# Patient Record
Sex: Female | Born: 1935 | Race: White | Hispanic: No | Smoking: Never smoker
Health system: Southern US, Community
[De-identification: ages and names within clinical notes are randomized; demographics above are authoritative.]

## PROBLEM LIST (undated history)

## (undated) DIAGNOSIS — E785 Hyperlipidemia, unspecified: Secondary | ICD-10-CM

## (undated) DIAGNOSIS — I10 Essential (primary) hypertension: Secondary | ICD-10-CM

## (undated) DIAGNOSIS — M8580 Other specified disorders of bone density and structure, unspecified site: Secondary | ICD-10-CM

## (undated) DIAGNOSIS — N6019 Diffuse cystic mastopathy of unspecified breast: Secondary | ICD-10-CM

## (undated) DIAGNOSIS — H269 Unspecified cataract: Secondary | ICD-10-CM

## (undated) DIAGNOSIS — N952 Postmenopausal atrophic vaginitis: Secondary | ICD-10-CM

## (undated) DIAGNOSIS — J449 Chronic obstructive pulmonary disease, unspecified: Secondary | ICD-10-CM

## (undated) HISTORY — DX: Postmenopausal atrophic vaginitis: N95.2

## (undated) HISTORY — DX: Diffuse cystic mastopathy of unspecified breast: N60.19

## (undated) HISTORY — DX: Unspecified cataract: H26.9

## (undated) HISTORY — DX: Chronic obstructive pulmonary disease, unspecified: J44.9

## (undated) HISTORY — DX: Essential (primary) hypertension: I10

## (undated) HISTORY — DX: Other specified disorders of bone density and structure, unspecified site: M85.80

## (undated) HISTORY — DX: Hyperlipidemia, unspecified: E78.5

---

## 2000-01-25 ENCOUNTER — Other Ambulatory Visit: Admission: RE | Admit: 2000-01-25 | Discharge: 2000-01-25 | Payer: Self-pay | Admitting: Family Medicine

## 2001-04-08 ENCOUNTER — Other Ambulatory Visit: Admission: RE | Admit: 2001-04-08 | Discharge: 2001-04-08 | Payer: Self-pay | Admitting: Family Medicine

## 2003-05-25 ENCOUNTER — Other Ambulatory Visit: Admission: RE | Admit: 2003-05-25 | Discharge: 2003-05-25 | Payer: Self-pay | Admitting: Family Medicine

## 2005-06-26 ENCOUNTER — Other Ambulatory Visit: Admission: RE | Admit: 2005-06-26 | Discharge: 2005-06-26 | Payer: Self-pay | Admitting: Family Medicine

## 2005-10-16 ENCOUNTER — Ambulatory Visit: Payer: Self-pay | Admitting: Gastroenterology

## 2005-10-30 ENCOUNTER — Ambulatory Visit: Payer: Self-pay | Admitting: Gastroenterology

## 2005-12-25 ENCOUNTER — Ambulatory Visit: Payer: Self-pay | Admitting: Cardiology

## 2009-07-04 LAB — HM PAP SMEAR

## 2009-10-20 LAB — FECAL OCCULT BLOOD, GUAIAC: Fecal Occult Blood: NEGATIVE

## 2010-04-18 LAB — HM MAMMOGRAPHY

## 2010-11-23 ENCOUNTER — Encounter: Payer: Self-pay | Admitting: Family Medicine

## 2010-11-23 DIAGNOSIS — M8580 Other specified disorders of bone density and structure, unspecified site: Secondary | ICD-10-CM

## 2010-11-23 DIAGNOSIS — N952 Postmenopausal atrophic vaginitis: Secondary | ICD-10-CM

## 2010-11-23 DIAGNOSIS — J449 Chronic obstructive pulmonary disease, unspecified: Secondary | ICD-10-CM

## 2010-11-23 DIAGNOSIS — E785 Hyperlipidemia, unspecified: Secondary | ICD-10-CM

## 2010-11-23 DIAGNOSIS — R3589 Other polyuria: Secondary | ICD-10-CM | POA: Insufficient documentation

## 2010-11-23 DIAGNOSIS — N6019 Diffuse cystic mastopathy of unspecified breast: Secondary | ICD-10-CM

## 2013-01-12 ENCOUNTER — Ambulatory Visit (INDEPENDENT_AMBULATORY_CARE_PROVIDER_SITE_OTHER): Payer: MEDICARE | Admitting: General Practice

## 2013-01-12 ENCOUNTER — Encounter: Payer: Self-pay | Admitting: General Practice

## 2013-01-12 ENCOUNTER — Telehealth: Payer: Self-pay | Admitting: Nurse Practitioner

## 2013-01-12 VITALS — BP 174/81 | HR 92 | Temp 97.7°F | Ht 61.0 in | Wt 118.5 lb

## 2013-01-12 DIAGNOSIS — L0390 Cellulitis, unspecified: Secondary | ICD-10-CM

## 2013-01-12 DIAGNOSIS — L0291 Cutaneous abscess, unspecified: Secondary | ICD-10-CM

## 2013-01-12 MED ORDER — SULFAMETHOXAZOLE-TRIMETHOPRIM 800-160 MG PO TABS: 1.0000 | ORAL_TABLET | Freq: Two times a day (BID) | ORAL | Status: DC

## 2013-01-12 NOTE — Patient Instructions (Addendum)
Cellulitis Cellulitis is an infection of the skin and the tissue beneath it. The infected area is usually red and tender. Cellulitis occurs most often in the arms and lower legs.   CAUSES   Cellulitis is caused by bacteria that enter the skin through cracks or cuts in the skin. The most common types of bacteria that cause cellulitis are Staphylococcus and Streptococcus. SYMPTOMS    Redness and warmth.   Swelling.   Tenderness or pain.   Fever.  DIAGNOSIS  Your caregiver can usually determine what is wrong based on a physical exam. Blood tests may also be done. TREATMENT   Treatment usually involves taking an antibiotic medicine. HOME CARE INSTRUCTIONS    Take your antibiotics as directed. Finish them even if you start to feel better.   Keep the infected arm or leg elevated to reduce swelling.   Apply a warm cloth to the affected area up to 4 times per day to relieve pain.   Only take over-the-counter or prescription medicines for pain, discomfort, or fever as directed by your caregiver.   Keep all follow-up appointments as directed by your caregiver.  SEEK MEDICAL CARE IF:    You notice red streaks coming from the infected area.   Your red area gets larger or turns dark in color.   Your bone or joint underneath the infected area becomes painful after the skin has healed.   Your infection returns in the same area or another area.   You notice a swollen bump in the infected area.   You develop new symptoms.  SEEK IMMEDIATE MEDICAL CARE IF:    You have a fever.   You feel very sleepy.   You develop vomiting or diarrhea.   You have a general ill feeling (malaise) with muscle aches and pains.  MAKE SURE YOU:    Understand these instructions.   Will watch your condition.   Will get help right away if you are not doing well or get worse.  Document Released: 05/22/2005 Document Revised: 02/11/2012 Document Reviewed: 10/28/2011 ExitCare Patient Information 2013  [employer].    

## 2013-01-12 NOTE — Progress Notes (Signed)
  Subjective:    Patient ID: Morgan Spears, female    DOB: Dec 07, 1924, 79 y.o.   MRN: [id number]  HPI Presents today with left lower arm healing wound. Reports hitting her arm and breaking the skin on last Tuesday. Reports the area is sore and slightly red. Reports the area bleed a little initially. Reports applying absorbine junior and antibiotic ointment, with no effects.     Review of Systems  Constitutional: Negative for fever and chills.  Respiratory: Negative for cough, chest tightness and shortness of breath.   Cardiovascular: Negative for chest pain and palpitations.  Skin: Positive for wound.       Scab to left lower arm   Neurological: Negative for dizziness, weakness and headaches.       Objective:   Physical Exam  Constitutional: She is oriented to person, place, and time. She appears well-developed and well-nourished.  Cardiovascular: Normal rate, regular rhythm and normal heart sounds.   Pulmonary/Chest: Effort normal and breath sounds normal. No respiratory distress. She exhibits no tenderness.  Neurological: She is alert and oriented to person, place, and time.  Skin: Skin is warm and dry. There is erythema.  Erythema to left lower arm, surrounding a 1/2 inch X 1/4 inch scabbed area.   Psychiatric: She has a normal mood and affect.          Assessment & Plan:  1. Cellulitis - sulfamethoxazole-trimethoprim (BACTRIM DS,SEPTRA DS) 800-160 MG per tablet; Take 1 tablet by mouth 2 (two) times daily.  Dispense: 14 tablet; Refill: 0 Keep area clean and dry Proper handwashing RTO if symptoms worsen Patient verbalized understanding Coralie Keens, FNP-C

## 2013-01-12 NOTE — Telephone Encounter (Signed)
APPT MADE

## 2013-01-15 ENCOUNTER — Telehealth: Payer: Self-pay | Admitting: Nurse Practitioner

## 2013-01-15 NOTE — Telephone Encounter (Signed)
Patient believes she had a TIA 2 days ago.  Patient was raising her window and her "head felt really funny like it burst open.  It didn't hurt though."  Sensation lasted approximately 5 minutes and then resolved.   Denies vision problems, weakness, or headache.  She feels completely normal now.    Speech was clear and patient was alert and oriented during phone call.

## 2013-01-15 NOTE — Telephone Encounter (Signed)
First appointment available is 01/19/13.  Suggested patient go to ER but she wanted to wait for appointment.  Advised to call 911 if she has any suspicious symptoms at all.  Patient agreed.  Appt scheduled for 01/19/13 with Paulene Floor, NP.

## 2013-01-19 ENCOUNTER — Encounter: Payer: Self-pay | Admitting: Nurse Practitioner

## 2013-01-19 ENCOUNTER — Ambulatory Visit (INDEPENDENT_AMBULATORY_CARE_PROVIDER_SITE_OTHER): Payer: MEDICARE | Admitting: Nurse Practitioner

## 2013-01-19 VITALS — BP 161/65 | HR 79 | Temp 97.5°F | Wt 116.0 lb

## 2013-01-19 DIAGNOSIS — R5383 Other fatigue: Secondary | ICD-10-CM

## 2013-01-19 DIAGNOSIS — R531 Weakness: Secondary | ICD-10-CM

## 2013-01-19 LAB — ANEMIA PANEL 7
%SAT: 23 % (ref 20–55)
ABS Retic: 56.4 10*3/uL (ref 19.0–186.0)
Ferritin: 522 ng/mL — ABNORMAL HIGH (ref 10–291)
Iron: 65 ug/dL (ref 42–145)
MCV: 86.6 fL (ref 78.0–100.0)
Platelets: 205 10*3/uL (ref 150–400)
RBC.: 4.7 MIL/uL (ref 3.87–5.11)
RBC: 4.7 MIL/uL (ref 3.87–5.11)
RDW: 15.1 % (ref 11.5–15.5)
WBC: 6.2 10*3/uL (ref 4.0–10.5)

## 2013-01-19 LAB — THYROID PANEL WITH TSH
Free Thyroxine Index: 2.7 (ref 1.0–3.9)
T3 Uptake: 27.2 % (ref 22.5–37.0)
T4, Total: 9.8 ug/dL (ref 5.0–12.5)
TSH: 3.507 u[IU]/mL (ref 0.350–4.500)

## 2013-01-19 NOTE — Progress Notes (Signed)
  Subjective:    Patient ID: Morgan Spears, female    DOB: [birth year], 79 y.o.   MRN: [id number]  HPI - Patient in stating that she had a mini stroke- [place] Spaniel that she felt like "explosion in head" and patient felt weak for a few days. Patient said that she had no one sided weakness, no facial dooping and no slurred speech. Patient says she just feels weak and tired.    Review of Systems  Constitutional: Negative for fever, chills, activity change and appetite change.  Respiratory: Negative for chest tightness and shortness of breath.   Cardiovascular: Negative for chest pain, palpitations and leg swelling.  Neurological: Positive for weakness (bilaterally). Negative for tremors, numbness and headaches.       Objective:   Physical Exam  Constitutional: She is oriented to person, place, and time. She appears well-developed and well-nourished.  HENT:  Head: Normocephalic.  Right Ear: External ear normal.  Nose: Nose normal.  Mouth/Throat: Oropharynx is clear and moist.  Eyes: EOM are normal. Pupils are equal, round, and reactive to light.  Neck: Normal range of motion. Neck supple.  Cardiovascular: Normal rate and normal heart sounds.   Pulmonary/Chest: Effort normal and breath sounds normal.  Musculoskeletal: Normal range of motion.  Neurological: She is alert and oriented to person, place, and time. She has normal reflexes. No cranial nerve deficit.  Skin: Skin is warm and dry.  Psychiatric: She has a normal mood and affect. Her behavior is normal. Judgment and thought content normal.  BP 161/65  Pulse 79  Temp(Src) 97.5 F (36.4 C) (Oral)  Wt 116 lb (52.617 kg)  BMI 21.93 kg/m2         Assessment & Plan:  1. Weakness Rest Force fluids RTO or go to ER is symptoms reoccur - Anemia panel 7 - Thyroid Panel With TSH - Vitamin B12  Mary-Margaret Daphine Deutscher, FNP

## 2013-01-19 NOTE — Patient Instructions (Signed)
Weakness  Weakness is a lack of strength. It may be felt all over the body (generalized) or in one specific part of the body (focal). Some causes of weakness can be serious. You may need further medical evaluation, especially if you are elderly or you have a history of immunosuppression (such as chemotherapy or HIV), kidney disease, heart disease, or diabetes.  CAUSES   Weakness can be caused by many different things, including:  · Infection.  · Physical exhaustion.  · Internal bleeding or other blood loss that results in a lack of red blood cells (anemia).  · Dehydration. This cause is more common in elderly people.  · Side effects or electrolyte abnormalities from medicines, such as pain medicines or sedatives.  · Emotional distress, anxiety, or depression.  · Circulation problems, especially severe peripheral arterial disease.  · Heart disease, such as rapid atrial fibrillation, bradycardia, or heart failure.  · Nervous system disorders, such as Guillain-Barré syndrome, multiple sclerosis, or stroke.  DIAGNOSIS   To find the cause of your weakness, your caregiver will take your history and perform a physical exam. Lab tests or X-rays may also be ordered, if needed.  TREATMENT   Treatment of weakness depends on the cause of your symptoms and can vary greatly.  HOME CARE INSTRUCTIONS   · Rest as needed.  · Eat a well-balanced diet.  · Try to get some exercise every day.  · Only take over-the-counter or prescription medicines as directed by your caregiver.  SEEK MEDICAL CARE IF:   · Your weakness seems to be getting worse or spreads to other parts of your body.  · You develop new aches or pains.  SEEK IMMEDIATE MEDICAL CARE IF:   · You cannot perform your normal daily activities, such as getting dressed and feeding yourself.  · You cannot walk up and down stairs, or you feel exhausted when you do so.  · You have shortness of breath or chest pain.  · You have difficulty moving parts of your body.  · You have weakness  in only one area of the body or on only one side of the body.  · You have a fever.  · You have trouble speaking or swallowing.  · You cannot control your bladder or bowel movements.  · You have black or bloody vomit or stools.  MAKE SURE YOU:  · Understand these instructions.  · Will watch your condition.  · Will get help right away if you are not doing well or get worse.  Document Released: 08/12/2005 Document Revised: 02/11/2012 Document Reviewed: 10/11/2011  ExitCare® Patient Information ©2014 [employer].

## 2013-01-26 ENCOUNTER — Telehealth: Payer: Self-pay | Admitting: Nurse Practitioner

## 2013-01-29 ENCOUNTER — Other Ambulatory Visit: Payer: Self-pay | Admitting: *Deleted

## 2013-01-29 MED ORDER — ROSUVASTATIN CALCIUM 20 MG PO TABS: 10.0000 mg | ORAL_TABLET | Freq: Every day | ORAL | Status: DC

## 2013-02-02 ENCOUNTER — Telehealth: Payer: Self-pay | Admitting: Family Medicine

## 2013-02-02 NOTE — Telephone Encounter (Signed)
Pt  Aware of labs  Daughter had already been notified

## 2013-02-02 NOTE — Telephone Encounter (Signed)
Dup note  

## 2013-02-04 NOTE — Telephone Encounter (Signed)
Pt aware of results 

## 2013-06-21 ENCOUNTER — Other Ambulatory Visit: Payer: Self-pay

## 2013-06-21 MED ORDER — ROSUVASTATIN CALCIUM 20 MG PO TABS: 10.0000 mg | ORAL_TABLET | Freq: Every day | ORAL | Status: DC

## 2013-06-21 NOTE — Telephone Encounter (Signed)
Please make an appointment for this patient to be seen 

## 2013-06-21 NOTE — Telephone Encounter (Signed)
Last lipid 09/25/11 [facility]

## 2013-06-29 ENCOUNTER — Ambulatory Visit (INDEPENDENT_AMBULATORY_CARE_PROVIDER_SITE_OTHER): Payer: MEDICARE

## 2013-06-29 DIAGNOSIS — Z23 Encounter for immunization: Secondary | ICD-10-CM

## 2013-07-07 ENCOUNTER — Encounter: Payer: Self-pay | Admitting: Nurse Practitioner

## 2013-07-07 ENCOUNTER — Ambulatory Visit (INDEPENDENT_AMBULATORY_CARE_PROVIDER_SITE_OTHER): Payer: MEDICARE | Admitting: Nurse Practitioner

## 2013-07-07 VITALS — BP 138/77 | HR 86 | Temp 97.8°F | Ht 62.0 in | Wt 129.0 lb

## 2013-07-07 DIAGNOSIS — L989 Disorder of the skin and subcutaneous tissue, unspecified: Secondary | ICD-10-CM

## 2013-07-07 NOTE — Progress Notes (Signed)
  Subjective:    Patient ID: Sidonie Dickens, female    DOB: 29-Aug-1924, 79 y.o.   MRN: [id number]  HPI  Patien tin c/o lesion left lower leg- started about 1 week ago- denies any injury- says that lesion is sore to the touch.    Review of Systems  All other systems reviewed and are negative.       Objective:   Physical Exam  Constitutional: She appears well-developed and well-nourished.  Cardiovascular: Normal rate and normal heart sounds.   Pulmonary/Chest: Effort normal and breath sounds normal.  Skin:  2cm raised flesh colored crusty lesion left lateral ankle- sore to touch -no draingae    BP 138/77  Pulse 86  Temp(Src) 97.8 F (36.6 C) (Oral)  Ht 5\' 2"  (1.575 m)  Wt 129 lb (58.514 kg)  BMI 23.59 kg/m2       Assessment & Plan:   1. Skin lesion of left lower limb    Orders Placed This Encounter  Procedures  . Ambulatory referral to Dermatology    Referral Priority:  Routine    Referral Type:  Consultation    Referral Reason:  Specialty Services Required    Requested Specialty:  Dermatology    Number of Visits Requested:  1   Do not pick at lesion  Mary-Margaret Daphine Deutscher, FNP

## 2013-08-30 ENCOUNTER — Other Ambulatory Visit: Payer: Self-pay

## 2013-08-30 MED ORDER — ROSUVASTATIN CALCIUM 20 MG PO TABS: 10.0000 mg | ORAL_TABLET | Freq: Every day | ORAL | Status: DC

## 2013-08-30 NOTE — Telephone Encounter (Signed)
Last seen 07/07/13  MMM  No lipids since EPIC

## 2013-10-29 ENCOUNTER — Other Ambulatory Visit: Payer: Self-pay | Admitting: Nurse Practitioner

## 2013-11-01 NOTE — Telephone Encounter (Signed)
Last seen 07/07/13  MMM  NO lipids since EPIC

## 2013-11-01 NOTE — Telephone Encounter (Signed)
No refill until seen for labs 

## 2013-11-02 NOTE — Telephone Encounter (Signed)
Patient aware and will make appt

## 2013-11-30 ENCOUNTER — Other Ambulatory Visit: Payer: Self-pay | Admitting: Nurse Practitioner

## 2013-12-01 NOTE — Telephone Encounter (Signed)
Pt made apt for 5/15

## 2013-12-15 DIAGNOSIS — Z85828 Personal history of other malignant neoplasm of skin: Secondary | ICD-10-CM | POA: Diagnosis not present

## 2013-12-15 DIAGNOSIS — D0470 Carcinoma in situ of skin of unspecified lower limb, including hip: Secondary | ICD-10-CM | POA: Diagnosis not present

## 2013-12-15 DIAGNOSIS — D485 Neoplasm of uncertain behavior of skin: Secondary | ICD-10-CM | POA: Diagnosis not present

## 2013-12-15 DIAGNOSIS — L570 Actinic keratosis: Secondary | ICD-10-CM | POA: Diagnosis not present

## 2013-12-23 DIAGNOSIS — C44721 Squamous cell carcinoma of skin of unspecified lower limb, including hip: Secondary | ICD-10-CM | POA: Diagnosis not present

## 2013-12-23 DIAGNOSIS — L570 Actinic keratosis: Secondary | ICD-10-CM | POA: Diagnosis not present

## 2013-12-27 ENCOUNTER — Ambulatory Visit: Payer: MEDICARE | Admitting: Nurse Practitioner

## 2013-12-30 DIAGNOSIS — I8310 Varicose veins of unspecified lower extremity with inflammation: Secondary | ICD-10-CM | POA: Diagnosis not present

## 2013-12-30 DIAGNOSIS — L97909 Non-pressure chronic ulcer of unspecified part of unspecified lower leg with unspecified severity: Secondary | ICD-10-CM | POA: Diagnosis not present

## 2013-12-31 ENCOUNTER — Other Ambulatory Visit: Payer: Self-pay | Admitting: Nurse Practitioner

## 2014-01-03 NOTE — Telephone Encounter (Signed)
Last seen 07/07/13  MMM  No lipids in EPIC

## 2014-01-03 NOTE — Telephone Encounter (Signed)
Patient NTBS for follow up and lab work- no refill of meds until seenpp

## 2014-01-06 DIAGNOSIS — I8310 Varicose veins of unspecified lower extremity with inflammation: Secondary | ICD-10-CM | POA: Diagnosis not present

## 2014-01-06 DIAGNOSIS — L0100 Impetigo, unspecified: Secondary | ICD-10-CM | POA: Diagnosis not present

## 2014-01-13 DIAGNOSIS — L97909 Non-pressure chronic ulcer of unspecified part of unspecified lower leg with unspecified severity: Secondary | ICD-10-CM | POA: Diagnosis not present

## 2014-01-13 DIAGNOSIS — L0100 Impetigo, unspecified: Secondary | ICD-10-CM | POA: Diagnosis not present

## 2014-01-13 DIAGNOSIS — I8310 Varicose veins of unspecified lower extremity with inflammation: Secondary | ICD-10-CM | POA: Diagnosis not present

## 2014-01-27 DIAGNOSIS — I8310 Varicose veins of unspecified lower extremity with inflammation: Secondary | ICD-10-CM | POA: Diagnosis not present

## 2014-01-27 DIAGNOSIS — L0100 Impetigo, unspecified: Secondary | ICD-10-CM | POA: Diagnosis not present

## 2014-02-04 ENCOUNTER — Other Ambulatory Visit: Payer: Self-pay | Admitting: Nurse Practitioner

## 2014-02-07 NOTE — Telephone Encounter (Signed)
Patient NTBS for follow up and lab work No refill on crestor until seen

## 2014-02-07 NOTE — Telephone Encounter (Signed)
No lipid in Epic. Pt ntbs.

## 2014-02-08 ENCOUNTER — Other Ambulatory Visit: Payer: Self-pay | Admitting: Nurse Practitioner

## 2014-02-08 ENCOUNTER — Telehealth: Payer: Self-pay | Admitting: Nurse Practitioner

## 2014-02-08 NOTE — Telephone Encounter (Signed)
Patient aware an appointment made

## 2014-02-08 NOTE — Telephone Encounter (Signed)
PAtient aware that she has to be seen she was made an appointment

## 2014-02-10 ENCOUNTER — Other Ambulatory Visit: Payer: Self-pay | Admitting: Nurse Practitioner

## 2014-02-10 ENCOUNTER — Other Ambulatory Visit: Payer: Self-pay | Admitting: Family Medicine

## 2014-02-10 ENCOUNTER — Encounter: Payer: Self-pay | Admitting: Nurse Practitioner

## 2014-02-10 ENCOUNTER — Ambulatory Visit (INDEPENDENT_AMBULATORY_CARE_PROVIDER_SITE_OTHER): Payer: MEDICARE | Admitting: Nurse Practitioner

## 2014-02-10 VITALS — BP 138/77 | HR 89 | Temp 98.5°F | Ht 62.0 in | Wt 121.0 lb

## 2014-02-10 DIAGNOSIS — M949 Disorder of cartilage, unspecified: Secondary | ICD-10-CM | POA: Diagnosis not present

## 2014-02-10 DIAGNOSIS — E785 Hyperlipidemia, unspecified: Secondary | ICD-10-CM | POA: Diagnosis not present

## 2014-02-10 DIAGNOSIS — M899 Disorder of bone, unspecified: Secondary | ICD-10-CM | POA: Diagnosis not present

## 2014-02-10 DIAGNOSIS — M8580 Other specified disorders of bone density and structure, unspecified site: Secondary | ICD-10-CM

## 2014-02-10 MED ORDER — ROSUVASTATIN CALCIUM 20 MG PO TABS: ORAL_TABLET | ORAL | Status: DC

## 2014-02-10 NOTE — Progress Notes (Signed)
   Subjective:    Patient ID: Morgan Spears, female    DOB: 05-22-1925, 80 y.o.   MRN: [id number]  Patient is here today for her 3 months follow up. She had surgery on her left leg 6 weeks ago done by Dr. Modena Nunnery.  No acute complaint.   Hyperlipidemia The current episode started more than 1 year ago. The problem is controlled. Pertinent negatives include no chest pain or focal sensory loss. Current antihyperlipidemic treatment includes exercise. There are no compliance problems.  Risk factors for coronary artery disease include post-menopausal.  Osteopenia Currently doing weight bearing exercises-= walks a mile a day     Review of Systems  Constitutional: Negative.   HENT: Negative.   Eyes: Negative.   Respiratory: Negative.   Cardiovascular: Negative.  Negative for chest pain.  Gastrointestinal: Negative.   Neurological: Negative.   Hematological: Negative.   Psychiatric/Behavioral: Negative.        Objective:   Physical Exam  Constitutional: She is oriented to person, place, and time. She appears well-developed and well-nourished.  HENT:  Head: Normocephalic and atraumatic.  Eyes: Pupils are equal, round, and reactive to light.  Neck: Normal range of motion.  Cardiovascular: Normal rate and regular rhythm.   Pulmonary/Chest: Effort normal and breath sounds normal.  Neurological: She is alert and oriented to person, place, and time.     BP 138/77  Pulse 89  Temp(Src) 98.5 F (36.9 C) (Oral)  Ht _0  (1.575 m)  Wt 121 lb (54.885 kg)  BMI 22.13 kg/m2      Assessment & Plan:   1. Other and unspecified hyperlipidemia   2. Osteopenia    Orders Placed This Encounter  Procedures  . DG Bone Density    Standing Status: Future     Number of Occurrences:      Standing Expiration Date: 04/12/2015    Order Specific Question:  Reason for Exam (SYMPTOM  OR DIAGNOSIS REQUIRED)    Answer:  osteopenia    Order Specific Question:  Preferred imaging location?    Answer:   Internal  . NMR, lipoprofile  . CMP14+EGFR   Meds ordered this encounter  Medications  . mupirocin ointment (BACTROBAN) 2 %    Sig:     Labs pending Health maintenance reviewed Diet and exercise encouraged Continue all meds Follow up  In 3 months   Reader, FNP

## 2014-02-10 NOTE — Patient Instructions (Signed)
Medicare Annual Wellness Visit  [place] and the medical providers at [facility] strive to bring you the best medical care.  In doing so we not only want to address your current medical conditions and concerns but also to detect new conditions early and prevent illness, disease and health-related problems.    Medicare offers a yearly Wellness Visit which allows our clinical staff to assess your need for preventative services including immunizations, lifestyle education, counseling to decrease risk of preventable diseases and screening for fall risk and other medical concerns.    This visit is provided free of charge (no copay) for all Medicare recipients. The clinical pharmacists at [facility] have begun to conduct these Wellness Visits which will also include a thorough review of all your medications.    As you primary medical provider recommend that you make an appointment for your Annual Wellness Visit if you have not done so already this year.  You may set up this appointment before you leave today or you may call back ([phone]) and schedule an appointment.  Please make sure when you call that you mention that you are scheduling your Annual Wellness Visit with the clinical pharmacist so that the appointment may be made for the proper length of time.    

## 2014-02-11 LAB — NMR, LIPOPROFILE
CHOLESTEROL: 211 mg/dL — AB (ref 100–199)
HDL CHOLESTEROL BY NMR: 57 mg/dL (ref >39)
HDL PARTICLE NUMBER: 35.2 umol/L (ref >=30.5)
LDL Particle Number: 1642 nmol/L — ABNORMAL HIGH (ref <1000)
LDL Size: 20.6 nm (ref >20.5)
LDLC SERPL CALC-MCNC: 133 mg/dL — ABNORMAL HIGH (ref 0–99)
LP-IR Score: 41 (ref <=45)
SMALL LDL PARTICLE NUMBER: 694 nmol/L — AB (ref <=527)
TRIGLYCERIDES BY NMR: 105 mg/dL (ref 0–149)

## 2014-02-11 LAB — CMP14+EGFR
ALK PHOS: 55 IU/L (ref 39–117)
ALT: 16 IU/L (ref 0–32)
AST: 28 IU/L (ref 0–40)
Albumin/Globulin Ratio: 1.9 (ref 1.1–2.5)
Albumin: 4.4 g/dL (ref 3.5–4.7)
BILIRUBIN TOTAL: 0.5 mg/dL (ref 0.0–1.2)
BUN / CREAT RATIO: 16 (ref 11–26)
BUN: 13 mg/dL (ref 8–27)
CO2: 25 mmol/L (ref 18–29)
Calcium: 9.6 mg/dL (ref 8.7–10.3)
Chloride: 105 mmol/L (ref 97–108)
Creatinine, Ser: 0.81 mg/dL (ref 0.57–1.00)
GFR calc non Af Amer: 66 mL/min/{1.73_m2} (ref >59)
GFR, EST AFRICAN AMERICAN: 76 mL/min/{1.73_m2} (ref >59)
Globulin, Total: 2.3 g/dL (ref 1.5–4.5)
Glucose: 96 mg/dL (ref 65–99)
POTASSIUM: 5.1 mmol/L (ref 3.5–5.2)
SODIUM: 145 mmol/L — AB (ref 134–144)
Total Protein: 6.7 g/dL (ref 6.0–8.5)

## 2014-04-27 LAB — HM DIABETES EYE EXAM

## 2014-05-04 ENCOUNTER — Ambulatory Visit (INDEPENDENT_AMBULATORY_CARE_PROVIDER_SITE_OTHER): Payer: MEDICARE | Admitting: Pharmacist

## 2014-05-04 ENCOUNTER — Ambulatory Visit (INDEPENDENT_AMBULATORY_CARE_PROVIDER_SITE_OTHER): Payer: MEDICARE

## 2014-05-04 ENCOUNTER — Encounter: Payer: Self-pay | Admitting: Pharmacist

## 2014-05-04 VITALS — BP 130/72 | HR 70 | Ht 60.5 in | Wt 119.0 lb

## 2014-05-04 DIAGNOSIS — Z0000 Encounter for general adult medical examination without abnormal findings: Secondary | ICD-10-CM | POA: Diagnosis not present

## 2014-05-04 DIAGNOSIS — M8580 Other specified disorders of bone density and structure, unspecified site: Secondary | ICD-10-CM

## 2014-05-04 DIAGNOSIS — M899 Disorder of bone, unspecified: Secondary | ICD-10-CM

## 2014-05-04 DIAGNOSIS — M949 Disorder of cartilage, unspecified: Secondary | ICD-10-CM

## 2014-05-04 LAB — HM DEXA SCAN

## 2014-05-04 NOTE — Progress Notes (Signed)
Patient ID: Morgan Spears, female   DOB: [birth year], 80 y.o.   MRN: [id number] Subjective:    Morgan Spears is a 80 y.o. female who presents for Medicare Initial Wellness and DEXA / osteopenia.  Preventive Screening-Counseling & Management  Tobacco History  Smoking status  . Never Smoker   Smokeless tobacco  . Never Used     Current Problems (verified) Patient Active Problem List   Diagnosis Date Noted  . Osteopenia   . Hyperlipidemia LDL goal < 100   . Diffuse cystic mastopathy   . Postmenopausal atrophic vaginitis     Medications Prior to Visit Current Outpatient Prescriptions on File Prior to Visit  Medication Sig Dispense Refill  . aspirin 81 MG EC tablet Take 81 mg by mouth daily.        . Calcium Carbonate-Vitamin D (CALCIUM 600 + D PO) Take 1 capsule by mouth daily.       . Cholecalciferol (VITAMIN D3) 1000 UNITS CAPS Take 1 capsule by mouth daily.        Marland Kitchen geriatric multivitamins-minerals (ELDERTONIC/GEVRABON) ELIX Take 15 mLs by mouth daily.        . mupirocin ointment (BACTROBAN) 2 %       . rosuvastatin (CRESTOR) 20 MG tablet TAKE 1/2 TABLET DAILY  30 tablet  5   No current facility-administered medications on file prior to visit.    Current Medications (verified) Current Outpatient Prescriptions  Medication Sig Dispense Refill  . aspirin 81 MG EC tablet Take 81 mg by mouth daily.        . Calcium Carbonate-Vitamin D (CALCIUM 600 + D PO) Take 1 capsule by mouth daily.       . Cholecalciferol (VITAMIN D3) 1000 UNITS CAPS Take 1 capsule by mouth daily.        Marland Kitchen geriatric multivitamins-minerals (ELDERTONIC/GEVRABON) ELIX Take 15 mLs by mouth daily.        . mupirocin ointment (BACTROBAN) 2 %       . rosuvastatin (CRESTOR) 20 MG tablet TAKE 1/2 TABLET DAILY  30 tablet  5   No current facility-administered medications for this visit.     Allergies (verified) Evista and Evista   PAST HISTORY  Family History Family History  Problem Relation Age of  Onset  . Heart disease Mother   . Hypertension Mother   . Alcohol abuse Father   . Cancer Sister   . Emphysema Sister     smoker  . Heart disease Brother   . Alcohol abuse Brother     Social History History  Substance Use Topics  . Smoking status: Never Smoker   . Smokeless tobacco: Never Used  . Alcohol Use: No     Comment: occassioal wine - 1-2 servings per week     Are there smokers in your home (other than you)? No  Risk Factors Current exercise habits: Home exercise routine includes walks 1 mile daily and yard work.  Dietary issues discussed: none   Cardiac risk factors: advanced age (older than 43 for men, 59 for women), dyslipidemia and family history of premature cardiovascular disease.  Depression Screen (Note: if answer to either of the following is "Yes", a more complete depression screening is indicated)   Over the past 2 weeks, have you felt down, depressed or hopeless? No  Over the past 2 weeks, have you felt little interest or pleasure in doing things? No  Have you lost interest or pleasure in daily life? No  Do you often feel  hopeless? No  Do you cry easily over simple problems? No  Activities of Daily Living In your present state of health, do you have any difficulty performing the following activities?:  Driving? No Managing money?  No Feeding yourself? No Getting from bed to chair? No  Climbing a flight of stairs? No Preparing food and eating?: No Bathing or showering? No Getting dressed: No Getting to the toilet? No Using the toilet:No Moving around from place to place: No In the past year have you fallen or had a near fall?:No   Are you sexually active?  No  Do you have more than one partner?  No  Hearing Difficulties: No Do you often ask people to speak up or repeat themselves? No Do you experience ringing or noises in your ears? No Do you have difficulty understanding soft or whispered voices? No   Do you feel that you have a problem  with memory? No  Do you often misplace items? No  Do you feel safe at home?  Yes  Cognitive Testing  Alert? Yes  Normal Appearance?Yes  Oriented to person? Yes  Place? Yes   Time? Yes  Recall of three objects?  Yes  Can perform simple calculations? Yes  Displays appropriate judgment?Yes  Can read the correct time from a watch face?Yes   Advanced Directives have been discussed with the patient? Yes  List the Names of Other Physician/Practitioners you currently use: 1.  Dermatology - Dr Modena Nunnery 2.  GI - Dr Sharlett Iles 3.  optomatrist - Dr Reynold Bowen ([facility] in [place], [place])  Indicate any recent Medical Services you may have received from other than [facility] providers in the past year (date may be approximate).  Immunization History  Administered Date(s) Administered  . Influenza Whole 05/26/2010  . Influenza,inj,Quad PF,36+ Mos 06/29/2013  . Pneumococcal Polysaccharide-23 05/26/2000  . Td 05/26/2000  . Zoster 05/27/2007    Screening Tests Health Maintenance  Topic Date Due  . Influenza Vaccine  03/26/2014  . Colonoscopy  08/29/2014 (Originally 08/26/2012)  . Tetanus/tdap  08/29/2014 (Originally 05/26/2010)  . Pneumococcal Polysaccharide Vaccine Age 14 And Over  Completed  . Zostavax  Completed   Last eye exam per patient - 03/2014 - records requested All answers were reviewed with the patient and necessary referrals were made:  Cherre Robins, [facility]   05/04/2014   History reviewed: allergies, current medications, past family history, past medical history, past social history, past surgical history and problem list   Objective:   Body mass index is 22.85 kg/(m^2). BP 130/72  Pulse 70  Ht 5' 0.5" (1.537 m)  Wt 119 lb (53.978 kg)  BMI 22.85 kg/m2     DEXA Results Date of Test T-Score for AP Spine L1-L4 T-Score for Total Left Hip T-Score for Total Right Hip  05/04/2014 -0.4 -1.1 -1.2  09/05/2010 -0.9 -1.4 -1.4  06/01/2008 -1.2 -1.4 -1.6  05/13/2006 -1.3 -1.4 -1.7      Assessment:     Initial Annual Wellness Visti Osteopenia - stable BMD    Plan:     During the course of the visit the patient was educated and counseled about appropriate screening and preventive services including:    Pneumococcal vaccine - patient declines  Influenza vaccine  Td vaccine - declines  Screening mammography - patient declines  Screening Pap smear and pelvic exam   Bone densitometry screening - done today  Colorectal cancer screening - patient declines  Diabetes screening  Glaucoma screening - requested copy of last visit and  screening from Dr Arvil Persons office  Advanced directives: has an advanced directive - a copy HAS NOT been provided.  continue calcium 1200mg  daily through supplementation or diet.   continue weight bearing exercise - 30 minutes at least 4 days per week.    Counseled and educated about fall risk and prevention.  Recheck DEXA:  2 years  Time spent counseling patient:  45 minutes  Patient Instructions (the written plan) was given to the patient.  Medicare Attestation I have personally reviewed: The patient's medical and social history Their use of alcohol, tobacco or illicit drugs Their current medications and supplements The patient's functional ability including ADLs,fall risks, home safety risks, cognitive, and hearing and visual impairment Diet and physical activities Evidence for depression or mood disorders  The patient's weight, height, BMI, and BP/HR have been recorded in the chart.  I have made referrals, counseling, and provided education to the patient based on review of the above and I have provided the patient with a written personalized care plan for preventive services.     Cherre Robins, [facility]   05/04/2014

## 2014-05-04 NOTE — Patient Instructions (Addendum)
Morgan Spears    MRN: [id number]      Health Maintenance Summary    INFLUENZA VACCINE Due in Fall of 2015       COLONOSCOPY No longer gets Routine colonoscopies      Dexa / Bone Denisty done 05/04/2014 Next due 05/04/2016     Tetnanus  Done 2001 Due now     Pneumonia Vaccine Completed       Eye Exam Done  03/2014 Next due 03/2015    Mammogram Last 11/23/2010 Due now    ZOSTAVAX Completed  Done 2008    Fall Prevention and Home Safety Falls cause injuries and can affect all age groups. It is possible to use preventive measures to significantly decrease the likelihood of falls. There are many simple measures which can make your home safer and prevent falls. OUTDOORS  Repair cracks and edges of walkways and driveways.  Remove high doorway thresholds.  Trim shrubbery on the main path into your home.  Have good outside lighting.  Clear walkways of tools, rocks, debris, and clutter.  Check that handrails are not broken and are securely fastened. Both sides of steps should have handrails.  Have leaves, snow, and ice cleared regularly.  Use sand or salt on walkways during winter months.  In the garage, clean up grease or oil spills. BATHROOM  Install night lights.  Install grab bars by the toilet and in the tub and shower.  Use non-skid mats or decals in the tub or shower.  Place a plastic non-slip stool in the shower to sit on, if needed.  Keep floors dry and clean up all water on the floor immediately.  Remove soap buildup in the tub or shower on a regular basis.  Secure bath mats with non-slip, double-sided rug tape.  Remove throw rugs and tripping hazards from the floors. BEDROOMS  Install night lights.  Make sure a bedside light is easy to reach.  Do not use oversized bedding.  Keep a telephone by your bedside.  Have a firm chair with side arms to use for getting dressed.  Remove throw rugs and tripping hazards from the floor. KITCHEN  Keep  handles on pots and pans turned toward the center of the stove. Use back burners when possible.  Clean up spills quickly and allow time for drying.  Avoid walking on wet floors.  Avoid hot utensils and knives.  Position shelves so they are not too high or low.  Place commonly used objects within easy reach.  If necessary, use a sturdy step stool with a grab bar when reaching.  Keep electrical cables out of the way.  Do not use floor polish or wax that makes floors slippery. If you must use wax, use non-skid floor wax.  Remove throw rugs and tripping hazards from the floor. STAIRWAYS  Never leave objects on stairs.  Place handrails on both sides of stairways and use them. Fix any loose handrails. Make sure handrails on both sides of the stairways are as long as the stairs.  Check carpeting to make sure it is firmly attached along stairs. Make repairs to worn or loose carpet promptly.  Avoid placing throw rugs at the top or bottom of stairways, or properly secure the rug with carpet tape to prevent slippage. Get rid of throw rugs, if possible.  Have an electrician put in a light switch at the top and bottom of the stairs. OTHER FALL PREVENTION TIPS  Wear low-heel or rubber-soled shoes that are supportive  and fit well. Wear closed toe shoes.  When using a stepladder, make sure it is fully opened and both spreaders are firmly locked. Do not climb a closed stepladder.  Add color or contrast paint or tape to grab bars and handrails in your home. Place contrasting color strips on first and last steps.  Learn and use mobility aids as needed. Install an electrical emergency response system.  Turn on lights to avoid dark areas. Replace light bulbs that burn out immediately. Get light switches that glow.  Arrange furniture to create clear pathways. Keep furniture in the same place.  Firmly attach carpet with non-skid or double-sided tape.  Eliminate uneven floor surfaces.  Select  a carpet pattern that does not visually hide the edge of steps.  Be aware of all pets. OTHER HOME SAFETY TIPS  Set the water temperature for 120 F (48.8 C).  Keep emergency numbers on or near the telephone.  Keep smoke detectors on every level of the home and near sleeping areas. Document Released: 08/02/2002 Document Revised: 02/11/2012 Document Reviewed: 11/01/2011 [facility] Patient Information 2015 [place], [place]. This information is not intended to replace advice given to you by your health care provider. Make sure you discuss any questions you have with your health care provider.   Preventive Care for Adults A healthy lifestyle and preventive care can promote health and wellness. Preventive health guidelines for women include the following key practices.  A routine yearly physical is a good way to check with your health care provider about your health and preventive screening. It is a chance to share any concerns and updates on your health and to receive a thorough exam.  Visit your dentist for a routine exam and preventive care every 6 months. Brush your teeth twice a day and floss once a day. Good oral hygiene prevents tooth decay and gum disease.  The frequency of eye exams is based on your age, health, family medical history, use of contact lenses, and other factors. Follow your health care provider's recommendations for frequency of eye exams.  Eat a healthy diet. Foods like vegetables, fruits, whole grains, low-fat dairy products, and lean protein foods contain the nutrients you need without too many calories. Decrease your intake of foods high in solid fats, added sugars, and salt. Eat the right amount of calories for you.Get information about a proper diet from your health care provider, if necessary.  Regular physical exercise is one of the most important things you can do for your health. Most adults should get at least 150 minutes of moderate-intensity exercise (any  activity that increases your heart rate and causes you to sweat) each week. In addition, most adults need muscle-strengthening exercises on 2 or more days a week.  Maintain a healthy weight. The body mass index (BMI) is a screening tool to identify possible weight problems. It provides an estimate of body fat based on height and weight. Your health care provider can find your BMI and can help you achieve or maintain a healthy weight.For adults 20 years and older:  A BMI below 18.5 is considered underweight.  A BMI of 18.5 to 24.9 is normal.  A BMI of 25 to 29.9 is considered overweight.  A BMI of 30 and above is considered obese.  Maintain normal blood lipids and cholesterol levels by exercising and minimizing your intake of saturated fat. Eat a balanced diet with plenty of fruit and vegetables. Blood tests for lipids and cholesterol should begin at age 74 and  be repeated every 5 years. If your lipid or cholesterol levels are high, you are over 50, or you are at high risk for heart disease, you may need your cholesterol levels checked more frequently.Ongoing high lipid and cholesterol levels should be treated with medicines if diet and exercise are not working.  If you smoke, find out from your health care provider how to quit. If you do not use tobacco, do not start.  Lung cancer screening is recommended for adults aged 55-80 years who are at high risk for developing lung cancer because of a history of smoking. A yearly low-dose CT scan of the lungs is recommended for people who have at least a 30-pack-year history of smoking and are a current smoker or have quit within the past 15 years. A pack year of smoking is smoking an average of 1 pack of cigarettes a day for 1 year (for example: 1 pack a day for 30 years or 2 packs a day for 15 years). Yearly screening should continue until the smoker has stopped smoking for at least 15 years. Yearly screening should be stopped for people who develop a  health problem that would prevent them from having lung cancer treatment.  If you are pregnant, do not drink alcohol. If you are breastfeeding, be very cautious about drinking alcohol. If you are not pregnant and choose to drink alcohol, do not have more than 1 drink per day. One drink is considered to be 12 ounces (355 mL) of beer, 5 ounces (148 mL) of wine, or 1.5 ounces (44 mL) of liquor.  Avoid use of street drugs. Do not share needles with anyone. Ask for help if you need support or instructions about stopping the use of drugs.  High blood pressure causes heart disease and increases the risk of stroke. Your blood pressure should be checked at least every 1 to 2 years. Ongoing high blood pressure should be treated with medicines if weight loss and exercise do not work.  If you are 46-52 years old, ask your health care provider if you should take aspirin to prevent strokes.  Diabetes screening involves taking a blood sample to check your fasting blood sugar level. This should be done once every 3 years, after age 70, if you are within normal weight and without risk factors for diabetes. Testing should be considered at a younger age or be carried out more frequently if you are overweight and have at least 1 risk factor for diabetes.  Breast cancer screening is essential preventive care for women. You should practice "breast self-awareness." This means understanding the normal appearance and feel of your breasts and may include breast self-examination. Any changes detected, no matter how small, should be reported to a health care provider. Women in their 65s and 30s should have a clinical breast exam (CBE) by a health care provider as part of a regular health exam every 1 to 3 years. After age 59, women should have a CBE every year. Starting at age 72, women should consider having a mammogram (breast X-ray test) every year. Women who have a family history of breast cancer should talk to their health  care provider about genetic screening. Women at a high risk of breast cancer should talk to their health care providers about having an MRI and a mammogram every year.  Breast cancer gene (BRCA)-related cancer risk assessment is recommended for women who have family members with BRCA-related cancers. BRCA-related cancers include breast, ovarian, tubal, and peritoneal cancers. Having  family members with these cancers may be associated with an increased risk for harmful changes (mutations) in the breast cancer genes BRCA1 and BRCA2. Results of the assessment will determine the need for genetic counseling and BRCA1 and BRCA2 testing.  Routine pelvic exams to screen for cancer are no longer recommended for nonpregnant women who are considered low risk for cancer of the pelvic organs (ovaries, uterus, and vagina) and who do not have symptoms. Ask your health care provider if a screening pelvic exam is right for you.  If you have had past treatment for cervical cancer or a condition that could lead to cancer, you need Pap tests and screening for cancer for at least 20 years after your treatment. If Pap tests have been discontinued, your risk factors (such as having a new sexual partner) need to be reassessed to determine if screening should be resumed. Some women have medical problems that increase the chance of getting cervical cancer. In these cases, your health care provider may recommend more frequent screening and Pap tests.  The HPV test is an additional test that may be used for cervical cancer screening. The HPV test looks for the virus that can cause the cell changes on the cervix. The cells collected during the Pap test can be tested for HPV. The HPV test could be used to screen women aged 63 years and older, and should be used in women of any age who have unclear Pap test results. After the age of 68, women should have HPV testing at the same frequency as a Pap test.  Colorectal cancer can be detected  and often prevented. Most routine colorectal cancer screening begins at the age of 82 years and continues through age 61 years. However, your health care provider may recommend screening at an earlier age if you have risk factors for colon cancer. On a yearly basis, your health care provider may provide home test kits to check for hidden blood in the stool. Use of a small camera at the end of a tube, to directly examine the colon (sigmoidoscopy or colonoscopy), can detect the earliest forms of colorectal cancer. Talk to your health care provider about this at age 81, when routine screening begins. Direct exam of the colon should be repeated every 5-10 years through age 36 years, unless early forms of pre-cancerous polyps or small growths are found.  People who are at an increased risk for hepatitis B should be screened for this virus. You are considered at high risk for hepatitis B if:  You were born in a country where hepatitis B occurs often. Talk with your health care provider about which countries are considered high risk.  Your parents were born in a high-risk country and you have not received a shot to protect against hepatitis B (hepatitis B vaccine).  You have HIV or AIDS.  You use needles to inject street drugs.  You live with, or have sex with, someone who has hepatitis B.  You get hemodialysis treatment.  You take certain medicines for conditions like cancer, organ transplantation, and autoimmune conditions.  Hepatitis C blood testing is recommended for all people born from [birth year] through 1963 and any individual with known risks for hepatitis C.  Practice safe sex. Use condoms and avoid high-risk sexual practices to reduce the spread of sexually transmitted infections (STIs). STIs include gonorrhea, chlamydia, syphilis, trichomonas, herpes, HPV, and human immunodeficiency virus (HIV). Herpes, HIV, and HPV are viral illnesses that have no cure. They can result  in disability, cancer, and  death.  You should be screened for sexually transmitted illnesses (STIs) including gonorrhea and chlamydia if:  You are sexually active and are younger than 24 years.  You are older than 24 years and your health care provider tells you that you are at risk for this type of infection.  Your sexual activity has changed since you were last screened and you are at an increased risk for chlamydia or gonorrhea. Ask your health care provider if you are at risk.  If you are at risk of being infected with HIV, it is recommended that you take a prescription medicine daily to prevent HIV infection. This is called preexposure prophylaxis (PrEP). You are considered at risk if:  You are a heterosexual woman, are sexually active, and are at increased risk for HIV infection.  You take drugs by injection.  You are sexually active with a partner who has HIV.  Talk with your health care provider about whether you are at high risk of being infected with HIV. If you choose to begin PrEP, you should first be tested for HIV. You should then be tested every 3 months for as long as you are taking PrEP.  Osteoporosis is a disease in which the bones lose minerals and strength with aging. This can result in serious bone fractures or breaks. The risk of osteoporosis can be identified using a bone density scan. Women ages 43 years and over and women at risk for fractures or osteoporosis should discuss screening with their health care providers. Ask your health care provider whether you should take a calcium supplement or vitamin D to reduce the rate of osteoporosis.  Menopause can be associated with physical symptoms and risks. Hormone replacement therapy is available to decrease symptoms and risks. You should talk to your health care provider about whether hormone replacement therapy is right for you.  Use sunscreen. Apply sunscreen liberally and repeatedly throughout the day. You should seek shade when your shadow is  shorter than you. Protect yourself by wearing long sleeves, pants, a wide-brimmed hat, and sunglasses year round, whenever you are outdoors.  Once a month, do a whole body skin exam, using a mirror to look at the skin on your back. Tell your health care provider of new moles, moles that have irregular borders, moles that are larger than a pencil eraser, or moles that have changed in shape or color.  Stay current with required vaccines (immunizations).  Influenza vaccine. All adults should be immunized every year.  Tetanus, diphtheria, and acellular pertussis (Td, Tdap) vaccine. Pregnant women should receive 1 dose of Tdap vaccine during each pregnancy. The dose should be obtained regardless of the length of time since the last dose. Immunization is preferred during the 27th-36th week of gestation. An adult who has not previously received Tdap or who does not know her vaccine status should receive 1 dose of Tdap. This initial dose should be followed by tetanus and diphtheria toxoids (Td) booster doses every 10 years. Adults with an unknown or incomplete history of completing a 3-dose immunization series with Td-containing vaccines should begin or complete a primary immunization series including a Tdap dose. Adults should receive a Td booster every 10 years.  Varicella vaccine. An adult without evidence of immunity to varicella should receive 2 doses or a second dose if she has previously received 1 dose. Pregnant females who do not have evidence of immunity should receive the first dose after pregnancy. This first dose should be  obtained before leaving the health care facility. The second dose should be obtained 4-8 weeks after the first dose.  Human papillomavirus (HPV) vaccine. Females aged 13-26 years who have not received the vaccine previously should obtain the 3-dose series. The vaccine is not recommended for use in pregnant females. However, pregnancy testing is not needed before receiving a dose.  If a female is found to be pregnant after receiving a dose, no treatment is needed. In that case, the remaining doses should be delayed until after the pregnancy. Immunization is recommended for any person with an immunocompromised condition through the age of 17 years if she did not get any or all doses earlier. During the 3-dose series, the second dose should be obtained 4-8 weeks after the first dose. The third dose should be obtained 24 weeks after the first dose and 16 weeks after the second dose.  Zoster vaccine. One dose is recommended for adults aged 93 years or older unless certain conditions are present.  Measles, mumps, and rubella (MMR) vaccine. Adults born before [birth year] generally are considered immune to measles and mumps. Adults born in [birth year] or later should have 1 or more doses of MMR vaccine unless there is a contraindication to the vaccine or there is laboratory evidence of immunity to each of the three diseases. A routine second dose of MMR vaccine should be obtained at least 28 days after the first dose for students attending postsecondary schools, health care workers, or international travelers. People who received inactivated measles vaccine or an unknown type of measles vaccine during 1963-1967 should receive 2 doses of MMR vaccine. People who received inactivated mumps vaccine or an unknown type of mumps vaccine before 1979 and are at high risk for mumps infection should consider immunization with 2 doses of MMR vaccine. For females of childbearing age, rubella immunity should be determined. If there is no evidence of immunity, females who are not pregnant should be vaccinated. If there is no evidence of immunity, females who are pregnant should delay immunization until after pregnancy. Unvaccinated health care workers born before [birth year] who lack laboratory evidence of measles, mumps, or rubella immunity or laboratory confirmation of disease should consider measles and mumps immunization with 2  doses of MMR vaccine or rubella immunization with 1 dose of MMR vaccine.  Pneumococcal 13-valent conjugate (PCV13) vaccine. When indicated, a person who is uncertain of her immunization history and has no record of immunization should receive the PCV13 vaccine. An adult aged 99 years or older who has certain medical conditions and has not been previously immunized should receive 1 dose of PCV13 vaccine. This PCV13 should be followed with a dose of pneumococcal polysaccharide (PPSV23) vaccine. The PPSV23 vaccine dose should be obtained at least 8 weeks after the dose of PCV13 vaccine. An adult aged 73 years or older who has certain medical conditions and previously received 1 or more doses of PPSV23 vaccine should receive 1 dose of PCV13. The PCV13 vaccine dose should be obtained 1 or more years after the last PPSV23 vaccine dose.  Pneumococcal polysaccharide (PPSV23) vaccine. When PCV13 is also indicated, PCV13 should be obtained first. All adults aged 62 years and older should be immunized. An adult younger than age 45 years who has certain medical conditions should be immunized. Any person who resides in a nursing home or long-term care facility should be immunized. An adult smoker should be immunized. People with an immunocompromised condition and certain other conditions should receive both PCV13 and PPSV23 vaccines. People  with human immunodeficiency virus (HIV) infection should be immunized as soon as possible after diagnosis. Immunization during chemotherapy or radiation therapy should be avoided. Routine use of PPSV23 vaccine is not recommended for American Indians, [place]atives, or people younger than 65 years unless there are medical conditions that require PPSV23 vaccine. When indicated, people who have unknown immunization and have no record of immunization should receive PPSV23 vaccine. One-time revaccination 5 years after the first dose of PPSV23 is recommended for people aged 19-64 years who  have chronic kidney failure, nephrotic syndrome, asplenia, or immunocompromised conditions. People who received 1-2 doses of PPSV23 before age 31 years should receive another dose of PPSV23 vaccine at age 18 years or later if at least 5 years have passed since the previous dose. Doses of PPSV23 are not needed for people immunized with PPSV23 at or after age 53 years.  Meningococcal vaccine. Adults with asplenia or persistent complement component deficiencies should receive 2 doses of quadrivalent meningococcal conjugate (MenACWY-D) vaccine. The doses should be obtained at least 2 months apart. Microbiologists working with certain meningococcal bacteria, [employer] recruits, people at risk during an outbreak, and people who travel to or live in countries with a high rate of meningitis should be immunized. A first-year college student up through age 81 years who is living in a residence hall should receive a dose if she did not receive a dose on or after her 16th birthday. Adults who have certain high-risk conditions should receive one or more doses of vaccine.  Hepatitis A vaccine. Adults who wish to be protected from this disease, have certain high-risk conditions, work with hepatitis A-infected animals, work in hepatitis A research labs, or travel to or work in countries with a high rate of hepatitis A should be immunized. Adults who were previously unvaccinated and who anticipate close contact with an international adoptee during the first 60 days after arrival in the [place] from a country with a high rate of hepatitis A should be immunized.  Hepatitis B vaccine. Adults who wish to be protected from this disease, have certain high-risk conditions, may be exposed to blood or other infectious body fluids, are household contacts or sex partners of hepatitis B positive people, are clients or workers in certain care facilities, or travel to or work in countries with a high rate of hepatitis B should be  immunized.  Haemophilus influenzae type b (Hib) vaccine. A previously unvaccinated person with asplenia or sickle cell disease or having a scheduled splenectomy should receive 1 dose of Hib vaccine. Regardless of previous immunization, a recipient of a hematopoietic stem cell transplant should receive a 3-dose series 6-12 months after her successful transplant. Hib vaccine is not recommended for adults with HIV infection.  Ages 2 years and over  Blood pressure check.** / Every 1 to 2 years.  Lipid and cholesterol check.** / Every 5 years beginning at age 42 years.  Lung cancer screening. / Every year if you are aged 71-80 years and have a 30-pack-year history of smoking and currently smoke or have quit within the past 15 years. Yearly screening is stopped once you have quit smoking for at least 15 years or develop a health problem that would prevent you from having lung cancer treatment.  Clinical breast exam.** / Every year after age 60 years.  BRCA-related cancer risk assessment.** / For women who have family members with a BRCA-related cancer (breast, ovarian, tubal, or peritoneal cancers).  Mammogram.** / Every year beginning at age 44  years and continuing for as long as you are in good health. Consult with your health care provider.  Pap test.** / Every 3 years starting at age 25 years through age 76 or 84 years with 3 consecutive normal Pap tests. Testing can be stopped between 65 and 70 years with 3 consecutive normal Pap tests and no abnormal Pap or HPV tests in the past 10 years.  HPV screening.** / Every 3 years from ages 80 years through ages 41 or 108 years with a history of 3 consecutive normal Pap tests. Testing can be stopped between 65 and 70 years with 3 consecutive normal Pap tests and no abnormal Pap or HPV tests in the past 10 years.  Fecal occult blood test (FOBT) of stool. / Every year beginning at age 56 years and continuing until age 76 years. You may not need to do  this test if you get a colonoscopy every 10 years.  Flexible sigmoidoscopy or colonoscopy.** / Every 5 years for a flexible sigmoidoscopy or every 10 years for a colonoscopy beginning at age 10 years and continuing until age 39 years.  Hepatitis C blood test.** / For all people born from [birth year] through 1963 and any individual with known risks for hepatitis C.  Osteoporosis screening.** / A one-time screening for women ages 28 years and over and women at risk for fractures or osteoporosis.  Skin self-exam. / Monthly.  Influenza vaccine. / Every year.  Tetanus, diphtheria, and acellular pertussis (Tdap/Td) vaccine.** / 1 dose of Td every 10 years.  Varicella vaccine.** / Consult your health care provider.  Zoster vaccine.** / 1 dose for adults aged 43 years or older.  Pneumococcal 13-valent conjugate (PCV13) vaccine.** / Consult your health care provider.  Pneumococcal polysaccharide (PPSV23) vaccine.** / 1 dose for all adults aged 57 years and older.  Meningococcal vaccine.** / Consult your health care provider.  Hepatitis A vaccine.** / Consult your health care provider.  Hepatitis B vaccine.** / Consult your health care provider.  Haemophilus influenzae type b (Hib) vaccine.** / Consult your health care provider. ** Family history and personal history of risk and conditions may change your health care provider's recommendations. Document Released: 10/08/2001 Document Revised: 12/27/2013 Document Reviewed: 01/07/2011 [facility] Patient Information 2015 [place], [place]. This information is not intended to replace advice given to you by your health care provider. Make sure you discuss any questions you have with your health care provider.

## 2014-06-29 DIAGNOSIS — L570 Actinic keratosis: Secondary | ICD-10-CM | POA: Diagnosis not present

## 2014-06-29 DIAGNOSIS — Z85828 Personal history of other malignant neoplasm of skin: Secondary | ICD-10-CM | POA: Diagnosis not present

## 2014-06-29 DIAGNOSIS — L309 Dermatitis, unspecified: Secondary | ICD-10-CM | POA: Diagnosis not present

## 2014-07-14 ENCOUNTER — Other Ambulatory Visit: Payer: Self-pay | Admitting: Nurse Practitioner

## 2014-08-11 ENCOUNTER — Ambulatory Visit: Payer: MEDICARE

## 2014-08-11 ENCOUNTER — Ambulatory Visit (INDEPENDENT_AMBULATORY_CARE_PROVIDER_SITE_OTHER): Payer: MEDICARE | Admitting: *Deleted

## 2014-08-11 DIAGNOSIS — Z23 Encounter for immunization: Secondary | ICD-10-CM

## 2014-09-13 ENCOUNTER — Other Ambulatory Visit: Payer: Self-pay | Admitting: Nurse Practitioner

## 2014-10-12 ENCOUNTER — Telehealth: Payer: Self-pay | Admitting: Nurse Practitioner

## 2014-10-12 ENCOUNTER — Other Ambulatory Visit: Payer: Self-pay | Admitting: Nurse Practitioner

## 2014-11-14 ENCOUNTER — Other Ambulatory Visit: Payer: Self-pay | Admitting: Nurse Practitioner

## 2014-11-14 MED ORDER — ROSUVASTATIN CALCIUM 20 MG PO TABS: ORAL_TABLET | ORAL | Status: DC

## 2014-11-14 NOTE — Telephone Encounter (Signed)
done

## 2014-12-08 ENCOUNTER — Encounter: Payer: Self-pay | Admitting: Nurse Practitioner

## 2014-12-08 ENCOUNTER — Ambulatory Visit (INDEPENDENT_AMBULATORY_CARE_PROVIDER_SITE_OTHER): Payer: MEDICARE | Admitting: Nurse Practitioner

## 2014-12-08 VITALS — BP 165/78 | HR 99 | Temp 96.9°F | Ht 60.0 in | Wt 121.0 lb

## 2014-12-08 DIAGNOSIS — E785 Hyperlipidemia, unspecified: Secondary | ICD-10-CM

## 2014-12-08 DIAGNOSIS — Z23 Encounter for immunization: Secondary | ICD-10-CM

## 2014-12-08 DIAGNOSIS — I10 Essential (primary) hypertension: Secondary | ICD-10-CM

## 2014-12-08 MED ORDER — HYDROCHLOROTHIAZIDE 12.5 MG PO CAPS: 12.5000 mg | ORAL_CAPSULE | Freq: Every day | ORAL | Status: DC

## 2014-12-08 MED ORDER — ROSUVASTATIN CALCIUM 20 MG PO TABS: ORAL_TABLET | ORAL | Status: DC

## 2014-12-08 NOTE — Addendum Note (Signed)
Addended by: Rolena Infante on: 12/08/2014 02:20 PM   Modules accepted: Orders

## 2014-12-08 NOTE — Patient Instructions (Signed)

## 2014-12-08 NOTE — Progress Notes (Signed)
   Subjective:    Patient ID: Morgan Spears, female    DOB: 1924/12/07, 81 y.o.   MRN: [id number]   Patient here today for follow up of chronic medical problems. Blood pressure has been running in the 665'L-935'T systolic at home. Has never been diagnosed with hypertension.   Hyperlipidemia This is a chronic problem. The current episode started more than 1 year ago. Recent lipid tests were reviewed and are variable. She has no history of diabetes, hypothyroidism or obesity. There are no known factors aggravating her hyperlipidemia. Current antihyperlipidemic treatment includes statins. The current treatment provides moderate improvement of lipids. Compliance problems include adherence to diet and adherence to exercise.  Risk factors for coronary artery disease include dyslipidemia.      Review of Systems  Constitutional: Negative.   HENT: Negative.   Respiratory: Negative.   Cardiovascular: Negative.   Genitourinary: Negative.   Neurological: Negative.   Psychiatric/Behavioral: Negative.   All other systems reviewed and are negative.      Objective:   Physical Exam  Constitutional: She is oriented to person, place, and time. She appears well-developed and well-nourished.  HENT:  Nose: Nose normal.  Mouth/Throat: Oropharynx is clear and moist.  Eyes: EOM are normal.  Neck: Trachea normal, normal range of motion and full passive range of motion without pain. Neck supple. No JVD present. Carotid bruit is not present. No thyromegaly present.  Cardiovascular: Normal rate, regular rhythm, normal heart sounds and intact distal pulses.  Exam reveals no gallop and no friction rub.   No murmur heard. Pulmonary/Chest: Effort normal and breath sounds normal.  Abdominal: Soft. Bowel sounds are normal. She exhibits no distension and no mass. There is no tenderness.  Musculoskeletal: Normal range of motion.  Lymphadenopathy:    She has no cervical adenopathy.  Neurological: She is alert  and oriented to person, place, and time. She has normal reflexes.  Skin: Skin is warm and dry.  Psychiatric: She has a normal mood and affect. Her behavior is normal. Judgment and thought content normal.    BP 165/78 mmHg  Pulse 99  Temp(Src) 96.9 F (36.1 C) (Oral)  Ht 5' (1.524 m)  Wt 121 lb (54.885 kg)  BMI 23.63 kg/m2        Assessment & Plan:  1. Essential hypertension Do not add slat to diet - hydrochlorothiazide (MICROZIDE) 12.5 MG capsule; Take 1 capsule (12.5 mg total) by mouth daily.  Dispense: 30 capsule; Refill: 5 - CMP14+EGFR  2. Hyperlipidemia with target LDL less than 100 Watch fats - rosuvastatin (CRESTOR) 20 MG tablet; TAKE 1/2 TABLET DAILY  Dispense: 30 tablet; Refill: 5 - NMR, lipoprofile   prevnar vaccine today Labs pending Health maintenance reviewed Diet and exercise encouraged Continue all meds Follow up  In 6 months   [place], FNP

## 2015-02-21 DIAGNOSIS — Z85828 Personal history of other malignant neoplasm of skin: Secondary | ICD-10-CM | POA: Diagnosis not present

## 2015-02-21 DIAGNOSIS — L97909 Non-pressure chronic ulcer of unspecified part of unspecified lower leg with unspecified severity: Secondary | ICD-10-CM | POA: Diagnosis not present

## 2015-02-21 DIAGNOSIS — L570 Actinic keratosis: Secondary | ICD-10-CM | POA: Diagnosis not present

## 2015-05-30 ENCOUNTER — Ambulatory Visit (INDEPENDENT_AMBULATORY_CARE_PROVIDER_SITE_OTHER): Payer: MEDICARE

## 2015-05-30 DIAGNOSIS — Z23 Encounter for immunization: Secondary | ICD-10-CM

## 2015-06-23 ENCOUNTER — Other Ambulatory Visit: Payer: Self-pay | Admitting: Nurse Practitioner

## 2015-06-26 ENCOUNTER — Ambulatory Visit (INDEPENDENT_AMBULATORY_CARE_PROVIDER_SITE_OTHER): Payer: MEDICARE

## 2015-06-26 ENCOUNTER — Encounter: Payer: Self-pay | Admitting: Nurse Practitioner

## 2015-06-26 ENCOUNTER — Ambulatory Visit (INDEPENDENT_AMBULATORY_CARE_PROVIDER_SITE_OTHER): Payer: MEDICARE | Admitting: Nurse Practitioner

## 2015-06-26 VITALS — BP 160/85 | HR 92 | Temp 97.2°F | Ht 60.0 in | Wt 122.0 lb

## 2015-06-26 DIAGNOSIS — I10 Essential (primary) hypertension: Secondary | ICD-10-CM

## 2015-06-26 DIAGNOSIS — E785 Hyperlipidemia, unspecified: Secondary | ICD-10-CM

## 2015-06-26 DIAGNOSIS — M8580 Other specified disorders of bone density and structure, unspecified site: Secondary | ICD-10-CM

## 2015-06-26 MED ORDER — HYDROCHLOROTHIAZIDE 25 MG PO TABS: 25.0000 mg | ORAL_TABLET | Freq: Every day | ORAL | Status: DC

## 2015-06-26 NOTE — Patient Instructions (Signed)
Hypertension Hypertension, commonly called high blood pressure, is when the force of blood pumping through your arteries is too strong. Your arteries are the blood vessels that carry blood from your heart throughout your body. A blood pressure reading consists of a higher number over a lower number, such as 110/72. The higher number (systolic) is the pressure inside your arteries when your heart pumps. The lower number (diastolic) is the pressure inside your arteries when your heart relaxes. Ideally you want your blood pressure below 120/80. Hypertension forces your heart to work harder to pump blood. Your arteries may become narrow or stiff. Having untreated or uncontrolled hypertension can cause heart attack, stroke, kidney disease, and other problems. RISK FACTORS Some risk factors for high blood pressure are controllable. Others are not.  Risk factors you cannot control include:   Race. You may be at higher risk if you are African American.  Age. Risk increases with age.  Gender. Men are at higher risk than women before age 45 years. After age 65, women are at higher risk than men. Risk factors you can control include:  Not getting enough exercise or physical activity.  Being overweight.  Getting too much fat, sugar, calories, or salt in your diet.  Drinking too much alcohol. SIGNS AND SYMPTOMS Hypertension does not usually cause signs or symptoms. Extremely high blood pressure (hypertensive crisis) may cause headache, anxiety, shortness of breath, and nosebleed. DIAGNOSIS To check if you have hypertension, your health care provider will measure your blood pressure while you are seated, with your arm held at the level of your heart. It should be measured at least twice using the same arm. Certain conditions can cause a difference in blood pressure between your right and left arms. A blood pressure reading that is higher than normal on one occasion does not mean that you need treatment. If  it is not clear whether you have high blood pressure, you may be asked to return on a different day to have your blood pressure checked again. Or, you may be asked to monitor your blood pressure at home for 1 or more weeks. TREATMENT Treating high blood pressure includes making lifestyle changes and possibly taking medicine. Living a healthy lifestyle can help lower high blood pressure. You may need to change some of your habits. Lifestyle changes may include:  Following the DASH diet. This diet is high in fruits, vegetables, and whole grains. It is low in salt, red meat, and added sugars.  Keep your sodium intake below 2,300 mg per day.  Getting at least 30-45 minutes of aerobic exercise at least 4 times per week.  Losing weight if necessary.  Not smoking.  Limiting alcoholic beverages.  Learning ways to reduce stress. Your health care provider may prescribe medicine if lifestyle changes are not enough to get your blood pressure under control, and if one of the following is true:  You are 18-59 years of age and your systolic blood pressure is above 140.  You are 60 years of age or older, and your systolic blood pressure is above 150.  Your diastolic blood pressure is above 90.  You have diabetes, and your systolic blood pressure is over 140 or your diastolic blood pressure is over 90.  You have kidney disease and your blood pressure is above 140/90.  You have heart disease and your blood pressure is above 140/90. Your personal target blood pressure may vary depending on your medical conditions, your age, and other factors. HOME CARE INSTRUCTIONS    Have your blood pressure rechecked as directed by your health care provider.   Take medicines only as directed by your health care provider. Follow the directions carefully. Blood pressure medicines must be taken as prescribed. The medicine does not work as well when you skip doses. Skipping doses also puts you at risk for  problems.  Do not smoke.   Monitor your blood pressure at home as directed by your health care provider. SEEK MEDICAL CARE IF:   You think you are having a reaction to medicines taken.  You have recurrent headaches or feel dizzy.  You have swelling in your ankles.  You have trouble with your vision. SEEK IMMEDIATE MEDICAL CARE IF:  You develop a severe headache or confusion.  You have unusual weakness, numbness, or feel faint.  You have severe chest or abdominal pain.  You vomit repeatedly.  You have trouble breathing. MAKE SURE YOU:   Understand these instructions.  Will watch your condition.  Will get help right away if you are not doing well or get worse.   This information is not intended to replace advice given to you by your health care provider. Make sure you discuss any questions you have with your health care provider.   Document Released: 08/12/2005 Document Revised: 12/27/2014 Document Reviewed: 06/04/2013 Elsevier Interactive Patient Education 2016 [employer]  

## 2015-06-26 NOTE — Progress Notes (Signed)
   Subjective:    Patient ID: Morgan Spears, female    DOB: Dec 16, 1924, 81 y.o.   MRN: [id number]   Patient here today for follow up of chronic medical problems.   Hyperlipidemia This is a chronic problem. The current episode started more than 1 year ago. Recent lipid tests were reviewed and are variable. She has no history of diabetes, hypothyroidism or obesity. There are no known factors aggravating her hyperlipidemia. Pertinent negatives include no chest pain. Current antihyperlipidemic treatment includes statins. The current treatment provides moderate improvement of lipids. Compliance problems include adherence to diet and adherence to exercise.  Risk factors for coronary artery disease include dyslipidemia.  Hypertension This is a new problem. The current episode started more than 1 month ago. The problem is unchanged. The problem is uncontrolled. Pertinent negatives include no chest pain, orthopnea or peripheral edema. Risk factors for coronary artery disease include post-menopausal state, dyslipidemia and family history. Past treatments include diuretics. The current treatment provides mild improvement. There is no history of CAD/MI or CVA.      Review of Systems  Constitutional: Negative.   HENT: Negative.   Respiratory: Negative.   Cardiovascular: Negative.  Negative for chest pain and orthopnea.  Genitourinary: Negative.   Neurological: Negative.   Psychiatric/Behavioral: Negative.   All other systems reviewed and are negative.      Objective:   Physical Exam  Constitutional: She is oriented to person, place, and time. She appears well-developed and well-nourished.  HENT:  Nose: Nose normal.  Mouth/Throat: Oropharynx is clear and moist.  Eyes: EOM are normal.  Neck: Trachea normal, normal range of motion and full passive range of motion without pain. Neck supple. No JVD present. Carotid bruit is not present. No thyromegaly present.  Cardiovascular: Normal rate,  regular rhythm, normal heart sounds and intact distal pulses.  Exam reveals no gallop and no friction rub.   No murmur heard. Pulmonary/Chest: Effort normal and breath sounds normal.  Abdominal: Soft. Bowel sounds are normal. She exhibits no distension and no mass. There is no tenderness.  Musculoskeletal: Normal range of motion.  Lymphadenopathy:    She has no cervical adenopathy.  Neurological: She is alert and oriented to person, place, and time. She has normal reflexes.  Skin: Skin is warm and dry.  Psychiatric: She has a normal mood and affect. Her behavior is normal. Judgment and thought content normal.    BP 160/85 mmHg  Pulse 92  Temp(Src) 97.2 F (36.2 C) (Oral)  Ht 5' (1.524 m)  Wt 122 lb (55.339 kg)  BMI 23.83 kg/m2  EKG- NSR-Morgan Hassell Done, FNP   Chest x-ray- no cardiopulmonary disease-Preliminary reading by Ronnald Collum, FNP  [facility]     Assessment & Plan:  1. Essential hypertension No added salt to diet Increased HCTZ to $Remov'25mg'rFCsyy$  daily Monitor blood pressures at home - CMP14+EGFR - DG Chest 2 View; Future - EKG 12-Lead - hydrochlorothiazide (HYDRODIURIL) 25 MG tablet; Take 1 tablet (25 mg total) by mouth daily.  Dispense: 90 tablet; Refill: 3  2. Hyperlipidemia with target LDL less than 100 Low fat diet - Lipid panel  3. Osteopenia Weight bearing exercises OTC Vitamin D3   Continue all meds Labs pending Health Maintenance reviewed Diet and exercise encouraged RTO 3 months  Morgan Hassell Done, FNP

## 2015-06-27 LAB — CMP14+EGFR
A/G RATIO: 1.6 (ref 1.1–2.5)
ALK PHOS: 62 IU/L (ref 39–117)
ALT: 17 IU/L (ref 0–32)
AST: 27 IU/L (ref 0–40)
Albumin: 4.6 g/dL (ref 3.5–4.7)
BILIRUBIN TOTAL: 0.3 mg/dL (ref 0.0–1.2)
BUN/Creatinine Ratio: 18 (ref 11–26)
BUN: 13 mg/dL (ref 8–27)
CHLORIDE: 96 mmol/L — AB (ref 97–106)
CO2: 28 mmol/L (ref 18–29)
Calcium: 9.5 mg/dL (ref 8.7–10.3)
Creatinine, Ser: 0.71 mg/dL (ref 0.57–1.00)
GFR calc non Af Amer: 77 mL/min/{1.73_m2} (ref >59)
GFR, EST AFRICAN AMERICAN: 89 mL/min/{1.73_m2} (ref >59)
Globulin, Total: 2.9 g/dL (ref 1.5–4.5)
Glucose: 88 mg/dL (ref 65–99)
POTASSIUM: 3.9 mmol/L (ref 3.5–5.2)
Sodium: 141 mmol/L (ref 136–144)
Total Protein: 7.5 g/dL (ref 6.0–8.5)

## 2015-06-27 LAB — LIPID PANEL
Chol/HDL Ratio: 2.7 ratio units (ref 0.0–4.4)
Cholesterol, Total: 188 mg/dL (ref 100–199)
HDL: 70 mg/dL (ref >39)
LDL Calculated: 100 mg/dL — ABNORMAL HIGH (ref 0–99)
TRIGLYCERIDES: 90 mg/dL (ref 0–149)
VLDL Cholesterol Cal: 18 mg/dL (ref 5–40)

## 2015-07-15 ENCOUNTER — Encounter: Payer: Self-pay | Admitting: Nurse Practitioner

## 2015-07-24 ENCOUNTER — Other Ambulatory Visit: Payer: Self-pay | Admitting: Nurse Practitioner

## 2015-08-15 DIAGNOSIS — D485 Neoplasm of uncertain behavior of skin: Secondary | ICD-10-CM | POA: Diagnosis not present

## 2015-08-15 DIAGNOSIS — Z85828 Personal history of other malignant neoplasm of skin: Secondary | ICD-10-CM | POA: Diagnosis not present

## 2015-08-15 DIAGNOSIS — L570 Actinic keratosis: Secondary | ICD-10-CM | POA: Diagnosis not present

## 2015-09-12 DIAGNOSIS — L570 Actinic keratosis: Secondary | ICD-10-CM | POA: Diagnosis not present

## 2015-11-03 ENCOUNTER — Other Ambulatory Visit: Payer: Self-pay | Admitting: Nurse Practitioner

## 2016-01-06 ENCOUNTER — Other Ambulatory Visit: Payer: Self-pay | Admitting: Nurse Practitioner

## 2016-02-13 DIAGNOSIS — L570 Actinic keratosis: Secondary | ICD-10-CM | POA: Diagnosis not present

## 2016-02-13 DIAGNOSIS — D485 Neoplasm of uncertain behavior of skin: Secondary | ICD-10-CM | POA: Diagnosis not present

## 2016-02-13 DIAGNOSIS — Z85828 Personal history of other malignant neoplasm of skin: Secondary | ICD-10-CM | POA: Diagnosis not present

## 2016-02-13 DIAGNOSIS — B078 Other viral warts: Secondary | ICD-10-CM | POA: Diagnosis not present

## 2016-02-26 ENCOUNTER — Other Ambulatory Visit: Payer: Self-pay | Admitting: Nurse Practitioner

## 2016-02-26 NOTE — Telephone Encounter (Signed)
Pt hasn't been seen since 06/26/15, ok to refill?

## 2016-02-26 NOTE — Telephone Encounter (Signed)
Last refill without being seen 

## 2016-02-29 NOTE — Telephone Encounter (Signed)
Left detailed message that no further refills would be given until she is seen in office and to call and schedule.

## 2016-03-27 ENCOUNTER — Encounter: Payer: Self-pay | Admitting: Nurse Practitioner

## 2016-03-27 ENCOUNTER — Other Ambulatory Visit: Payer: Self-pay | Admitting: Nurse Practitioner

## 2016-03-28 NOTE — Telephone Encounter (Signed)
Patient has appointment 8/14

## 2016-03-28 NOTE — Telephone Encounter (Signed)
Last refill without being seen 

## 2016-04-02 ENCOUNTER — Other Ambulatory Visit: Payer: Self-pay | Admitting: Nurse Practitioner

## 2016-04-02 ENCOUNTER — Encounter: Payer: Self-pay | Admitting: Nurse Practitioner

## 2016-04-02 ENCOUNTER — Ambulatory Visit (INDEPENDENT_AMBULATORY_CARE_PROVIDER_SITE_OTHER): Payer: MEDICARE | Admitting: Nurse Practitioner

## 2016-04-02 ENCOUNTER — Ambulatory Visit (INDEPENDENT_AMBULATORY_CARE_PROVIDER_SITE_OTHER): Payer: MEDICARE

## 2016-04-02 VITALS — BP 152/88 | HR 93 | Temp 97.3°F | Ht 60.0 in | Wt 122.0 lb

## 2016-04-02 DIAGNOSIS — R0609 Other forms of dyspnea: Secondary | ICD-10-CM

## 2016-04-02 MED ORDER — FLUTICASONE-SALMETEROL 100-50 MCG/DOSE IN AEPB: 1.0000 | INHALATION_SPRAY | Freq: Two times a day (BID) | RESPIRATORY_TRACT | 2 refills | Status: DC

## 2016-04-02 MED ORDER — FUROSEMIDE 20 MG PO TABS: 20.0000 mg | ORAL_TABLET | Freq: Once | ORAL | Status: DC

## 2016-04-02 NOTE — Progress Notes (Signed)
   Subjective:    Patient ID: Morgan Spears, female    DOB: 01/25/1925, 82 y.o.   MRN: [id number]  HPI Patient brought in today by daughter. Daughter said that she started complaing Sunday of SOB. Still occurring- mainly when she is up moving around. Denies chest pain or peripheral edema.   Review of Systems  Constitutional: Negative.  Negative for fever.  HENT: Negative.   Respiratory: Positive for shortness of breath.   Cardiovascular: Negative for chest pain, palpitations and leg swelling.  Gastrointestinal: Negative.   Genitourinary: Negative.   Neurological: Negative.   Psychiatric/Behavioral: Negative.   All other systems reviewed and are negative.      Objective:   Physical Exam  Constitutional: She is oriented to person, place, and time. She appears well-developed and well-nourished. No distress.  Cardiovascular: Normal rate, regular rhythm and normal heart sounds.   Pulmonary/Chest: She has rales (right lower lobe).  Neurological: She is alert and oriented to person, place, and time.  Skin: Skin is warm.  Psychiatric: She has a normal mood and affect. Her behavior is normal. Judgment and thought content normal.   BP (!) 152/88 (BP Location: Left Arm, Cuff Size: Normal)   Pulse 93   Temp 97.3 F (36.3 C) (Oral)   Ht 5' (1.524 m)   Wt 122 lb (55.3 kg)   SpO2 98%   BMI 23.83 kg/m     EKG- NSR- unchanged from previous [place], FNP  Chest xray- radiology pending    Assessment & Plan:  1. DOE (dyspnea on exertion) Stop HCTZ Meds ordered this encounter  Medications  . furosemide (LASIX) tablet 20 mg   Keep follow up appointment next week To ER if worsens or develops chest pain - DG Chest 2 View; Future - EKG 12-Lead  Mary-Margaret Hassell Done, FNP

## 2016-04-02 NOTE — Patient Instructions (Signed)

## 2016-04-04 ENCOUNTER — Telehealth: Payer: Self-pay | Admitting: *Deleted

## 2016-04-04 NOTE — Telephone Encounter (Signed)
Pt notified of results Verbalizes understanding 

## 2016-04-04 NOTE — Telephone Encounter (Signed)
-----   Message from [facility], [place] sent at 04/02/2016  3:56 PM EDT ----- Tell patient just COPd changes- going to send in inhaler for her to do 1 puff BID and rinse mouth after use

## 2016-04-08 ENCOUNTER — Ambulatory Visit (INDEPENDENT_AMBULATORY_CARE_PROVIDER_SITE_OTHER): Payer: MEDICARE | Admitting: Nurse Practitioner

## 2016-04-08 ENCOUNTER — Encounter: Payer: Self-pay | Admitting: Nurse Practitioner

## 2016-04-08 VITALS — BP 162/82 | HR 70 | Temp 96.9°F | Ht 60.0 in | Wt 123.0 lb

## 2016-04-08 DIAGNOSIS — E785 Hyperlipidemia, unspecified: Secondary | ICD-10-CM

## 2016-04-08 DIAGNOSIS — I10 Essential (primary) hypertension: Secondary | ICD-10-CM

## 2016-04-08 DIAGNOSIS — J410 Simple chronic bronchitis: Secondary | ICD-10-CM | POA: Diagnosis not present

## 2016-04-08 MED ORDER — LISINOPRIL-HYDROCHLOROTHIAZIDE 10-12.5 MG PO TABS: 1.0000 | ORAL_TABLET | Freq: Every day | ORAL | 3 refills | Status: DC

## 2016-04-08 MED ORDER — ROSUVASTATIN CALCIUM 10 MG PO TABS: 10.0000 mg | ORAL_TABLET | Freq: Every day | ORAL | 5 refills | Status: DC

## 2016-04-08 NOTE — Patient Instructions (Signed)
Hypertension Hypertension, commonly called high blood pressure, is when the force of blood pumping through your arteries is too strong. Your arteries are the blood vessels that carry blood from your heart throughout your body. A blood pressure reading consists of a higher number over a lower number, such as 110/72. The higher number (systolic) is the pressure inside your arteries when your heart pumps. The lower number (diastolic) is the pressure inside your arteries when your heart relaxes. Ideally you want your blood pressure below 120/80. Hypertension forces your heart to work harder to pump blood. Your arteries may become narrow or stiff. Having untreated or uncontrolled hypertension can cause heart attack, stroke, kidney disease, and other problems. RISK FACTORS Some risk factors for high blood pressure are controllable. Others are not.  Risk factors you cannot control include:   Race. You may be at higher risk if you are African American.  Age. Risk increases with age.  Gender. Men are at higher risk than women before age 45 years. After age 65, women are at higher risk than men. Risk factors you can control include:  Not getting enough exercise or physical activity.  Being overweight.  Getting too much fat, sugar, calories, or salt in your diet.  Drinking too much alcohol. SIGNS AND SYMPTOMS Hypertension does not usually cause signs or symptoms. Extremely high blood pressure (hypertensive crisis) may cause headache, anxiety, shortness of breath, and nosebleed. DIAGNOSIS To check if you have hypertension, your health care provider will measure your blood pressure while you are seated, with your arm held at the level of your heart. It should be measured at least twice using the same arm. Certain conditions can cause a difference in blood pressure between your right and left arms. A blood pressure reading that is higher than normal on one occasion does not mean that you need treatment. If  it is not clear whether you have high blood pressure, you may be asked to return on a different day to have your blood pressure checked again. Or, you may be asked to monitor your blood pressure at home for 1 or more weeks. TREATMENT Treating high blood pressure includes making lifestyle changes and possibly taking medicine. Living a healthy lifestyle can help lower high blood pressure. You may need to change some of your habits. Lifestyle changes may include:  Following the DASH diet. This diet is high in fruits, vegetables, and whole grains. It is low in salt, red meat, and added sugars.  Keep your sodium intake below 2,300 mg per day.  Getting at least 30-45 minutes of aerobic exercise at least 4 times per week.  Losing weight if necessary.  Not smoking.  Limiting alcoholic beverages.  Learning ways to reduce stress. Your health care provider may prescribe medicine if lifestyle changes are not enough to get your blood pressure under control, and if one of the following is true:  You are 18-59 years of age and your systolic blood pressure is above 140.  You are 60 years of age or older, and your systolic blood pressure is above 150.  Your diastolic blood pressure is above 90.  You have diabetes, and your systolic blood pressure is over 140 or your diastolic blood pressure is over 90.  You have kidney disease and your blood pressure is above 140/90.  You have heart disease and your blood pressure is above 140/90. Your personal target blood pressure may vary depending on your medical conditions, your age, and other factors. HOME CARE INSTRUCTIONS    Have your blood pressure rechecked as directed by your health care provider.   Take medicines only as directed by your health care provider. Follow the directions carefully. Blood pressure medicines must be taken as prescribed. The medicine does not work as well when you skip doses. Skipping doses also puts you at risk for  problems.  Do not smoke.   Monitor your blood pressure at home as directed by your health care provider. SEEK MEDICAL CARE IF:   You think you are having a reaction to medicines taken.  You have recurrent headaches or feel dizzy.  You have swelling in your ankles.  You have trouble with your vision. SEEK IMMEDIATE MEDICAL CARE IF:  You develop a severe headache or confusion.  You have unusual weakness, numbness, or feel faint.  You have severe chest or abdominal pain.  You vomit repeatedly.  You have trouble breathing. MAKE SURE YOU:   Understand these instructions.  Will watch your condition.  Will get help right away if you are not doing well or get worse.   This information is not intended to replace advice given to you by your health care provider. Make sure you discuss any questions you have with your health care provider.   Document Released: 08/12/2005 Document Revised: 12/27/2014 Document Reviewed: 06/04/2013 Elsevier Interactive Patient Education 2016 [employer]  

## 2016-04-08 NOTE — Progress Notes (Signed)
Subjective:    Patient ID: Morgan Spears, female    DOB: Aug 25, 1925, 82 y.o.   MRN: [id number]   Patient here today for follow up of chronic medical problems.  Outpatient Encounter Prescriptions as of 04/08/2016  Medication Sig  . aspirin 81 MG EC tablet Take 81 mg by mouth daily.    . Calcium Carbonate-Vitamin D (CALCIUM 600 + D PO) Take 1 capsule by mouth daily.   . Cholecalciferol (VITAMIN D3) 1000 UNITS CAPS Take 1 capsule by mouth daily.    . Fluticasone-Salmeterol (ADVAIR DISKUS) 100-50 MCG/DOSE AEPB Inhale 1 puff into the lungs 2 (two) times daily.  Marland Kitchen geriatric multivitamins-minerals (ELDERTONIC/GEVRABON) ELIX Take 15 mLs by mouth daily.    . rosuvastatin (CRESTOR) 10 MG tablet TAKE ONE (1) TABLET EACH DAY   Facility-Administered Encounter Medications as of 04/08/2016  Medication  . furosemide (LASIX) tablet 20 mg   * Patient was seen last week with SOB and was switched from HCTZ to lasix but lasix was ordered as in house med so was not picked up- after patient left office her x ray report came back as mild COPD so we started her on advair and that seems to be helping.  Hyperlipidemia  This is a chronic problem. The current episode started more than 1 year ago. Recent lipid tests were reviewed and are variable. She has no history of diabetes, hypothyroidism or obesity. There are no known factors aggravating her hyperlipidemia. Pertinent negatives include no chest pain. Current antihyperlipidemic treatment includes statins. The current treatment provides moderate improvement of lipids. Compliance problems include adherence to diet and adherence to exercise.  Risk factors for coronary artery disease include dyslipidemia.  Hypertension  This is a new problem. The current episode started more than 1 month ago. The problem is unchanged. The problem is uncontrolled. Pertinent negatives include no chest pain, orthopnea or peripheral edema. Risk factors for coronary artery disease include  post-menopausal state, dyslipidemia and family history. Past treatments include diuretics. The current treatment provides mild improvement. There is no history of CAD/MI or CVA.      Review of Systems  Constitutional: Negative.   HENT: Negative.   Respiratory: Negative.   Cardiovascular: Negative.  Negative for chest pain and orthopnea.  Genitourinary: Negative.   Neurological: Negative.   Psychiatric/Behavioral: Negative.   All other systems reviewed and are negative.      Objective:   Physical Exam  Constitutional: She is oriented to person, place, and time. She appears well-developed and well-nourished.  HENT:  Nose: Nose normal.  Mouth/Throat: Oropharynx is clear and moist.  Eyes: EOM are normal.  Neck: Trachea normal, normal range of motion and full passive range of motion without pain. Neck supple. No JVD present. Carotid bruit is not present. No thyromegaly present.  Cardiovascular: Normal rate, normal heart sounds and intact distal pulses.  Exam reveals no gallop and no friction rub.   No murmur heard. Regular irregular heart beat  Pulmonary/Chest: Effort normal and breath sounds normal.  Abdominal: Soft. Bowel sounds are normal. She exhibits no distension and no mass. There is no tenderness.  Musculoskeletal: Normal range of motion.  Lymphadenopathy:    She has no cervical adenopathy.  Neurological: She is alert and oriented to person, place, and time. She has normal reflexes.  Skin: Skin is warm and dry.  Psychiatric: She has a normal mood and affect. Her behavior is normal. Judgment and thought content normal.    BP (!) 162/82 (BP Location: Left Arm, Cuff  Size: Normal)   Pulse 70   Temp (!) 96.9 F (36.1 C) (Oral)   Ht 5' (1.524 m)   Wt 123 lb (55.8 kg)   BMI 24.02 kg/m        Assessment & Plan:   1. Essential hypertension Never started lasix-  Added lisinopril/hctz to meds - lisinopril-hydrochlorothiazide (PRINZIDE,ZESTORETIC) 10-12.5 MG tablet; Take  1 tablet by mouth daily.  Dispense: 90 tablet; Refill: 3 - CMP14+EGFR  2. Hyperlipidemia with target LDL less than 100 Low fat diet - rosuvastatin (CRESTOR) 10 MG tablet; Take 1 tablet (10 mg total) by mouth daily.  Dispense: 30 tablet; Refill: 5 - Lipid panel  3. Simple chronic bronchitis ([place]) Continue advair as ordered- make sure rinse mouth out after use    Labs pending Health maintenance reviewed Diet and exercise encouraged Continue all meds Follow up  In 3 months   [place], FNP

## 2016-04-09 LAB — LIPID PANEL
CHOLESTEROL TOTAL: 156 mg/dL (ref 100–199)
Chol/HDL Ratio: 2.6 ratio units (ref 0.0–4.4)
HDL: 59 mg/dL (ref >39)
LDL Calculated: 80 mg/dL (ref 0–99)
Triglycerides: 87 mg/dL (ref 0–149)
VLDL CHOLESTEROL CAL: 17 mg/dL (ref 5–40)

## 2016-04-09 LAB — CMP14+EGFR
A/G RATIO: 1.6 (ref 1.2–2.2)
ALBUMIN: 4.5 g/dL (ref 3.5–4.7)
ALK PHOS: 55 IU/L (ref 39–117)
ALT: 16 IU/L (ref 0–32)
AST: 27 IU/L (ref 0–40)
BILIRUBIN TOTAL: 0.5 mg/dL (ref 0.0–1.2)
BUN / CREAT RATIO: 14 (ref 12–28)
BUN: 11 mg/dL (ref 8–27)
CHLORIDE: 97 mmol/L (ref 96–106)
CO2: 27 mmol/L (ref 18–29)
CREATININE: 0.78 mg/dL (ref 0.57–1.00)
Calcium: 9.9 mg/dL (ref 8.7–10.3)
GFR calc Af Amer: 78 mL/min/{1.73_m2} (ref >59)
GFR calc non Af Amer: 68 mL/min/{1.73_m2} (ref >59)
GLOBULIN, TOTAL: 2.9 g/dL (ref 1.5–4.5)
Glucose: 94 mg/dL (ref 65–99)
POTASSIUM: 4.5 mmol/L (ref 3.5–5.2)
SODIUM: 141 mmol/L (ref 134–144)
Total Protein: 7.4 g/dL (ref 6.0–8.5)

## 2016-06-05 ENCOUNTER — Other Ambulatory Visit: Payer: Self-pay | Admitting: Nurse Practitioner

## 2016-06-05 ENCOUNTER — Telehealth: Payer: Self-pay | Admitting: Nurse Practitioner

## 2016-06-06 ENCOUNTER — Other Ambulatory Visit: Payer: Self-pay

## 2016-06-06 MED ORDER — FLUTICASONE-SALMETEROL 100-50 MCG/DOSE IN AEPB: 1.0000 | INHALATION_SPRAY | Freq: Two times a day (BID) | RESPIRATORY_TRACT | 2 refills | Status: DC

## 2016-06-06 NOTE — Telephone Encounter (Signed)
done

## 2016-07-09 ENCOUNTER — Ambulatory Visit (INDEPENDENT_AMBULATORY_CARE_PROVIDER_SITE_OTHER): Payer: MEDICARE | Admitting: Nurse Practitioner

## 2016-07-09 ENCOUNTER — Encounter: Payer: Self-pay | Admitting: Nurse Practitioner

## 2016-07-09 VITALS — BP 144/82 | HR 85 | Temp 98.0°F | Ht 60.0 in | Wt 122.0 lb

## 2016-07-09 DIAGNOSIS — Z23 Encounter for immunization: Secondary | ICD-10-CM

## 2016-07-09 DIAGNOSIS — I10 Essential (primary) hypertension: Secondary | ICD-10-CM

## 2016-07-09 DIAGNOSIS — E785 Hyperlipidemia, unspecified: Secondary | ICD-10-CM

## 2016-07-09 NOTE — Progress Notes (Signed)
Subjective:    Patient ID: Morgan Spears, female    DOB: 05-26-1925, 82 y.o.   MRN: [id number]   Patient here today for follow up of chronic medical problems. No changes since last visit. No complaints today  Outpatient Encounter Prescriptions as of 07/09/2016  Medication Sig  . aspirin 81 MG EC tablet Take 81 mg by mouth daily.    . Calcium Carbonate-Vitamin D (CALCIUM 600 + D PO) Take 1 capsule by mouth daily.   . Cholecalciferol (VITAMIN D3) 1000 UNITS CAPS Take 1 capsule by mouth daily.    . Fluticasone-Salmeterol (ADVAIR DISKUS) 100-50 MCG/DOSE AEPB Inhale 1 puff into the lungs 2 (two) times daily.  Marland Kitchen geriatric multivitamins-minerals (ELDERTONIC/GEVRABON) ELIX Take 15 mLs by mouth daily.    Marland Kitchen lisinopril-hydrochlorothiazide (PRINZIDE,ZESTORETIC) 10-12.5 MG tablet Take 1 tablet by mouth daily.  . rosuvastatin (CRESTOR) 10 MG tablet Take 1 tablet (10 mg total) by mouth daily.   No facility-administered encounter medications on file as of 07/09/2016.    Hyperlipidemia  This is a chronic problem. The current episode started more than 1 year ago. Recent lipid tests were reviewed and are variable. She has no history of diabetes, hypothyroidism or obesity. There are no known factors aggravating her hyperlipidemia. Pertinent negatives include no chest pain. Current antihyperlipidemic treatment includes statins. The current treatment provides moderate improvement of lipids. Compliance problems include adherence to diet and adherence to exercise.  Risk factors for coronary artery disease include dyslipidemia.  Hypertension  This is a new problem. The current episode started more than 1 month ago. The problem is unchanged. The problem is controlled (blood pressures at home run consistently below 350 systolic.). Pertinent negatives include no chest pain, orthopnea or peripheral edema. Risk factors for coronary artery disease include post-menopausal state, dyslipidemia and family history. Past  treatments include diuretics. The current treatment provides mild improvement. There is no history of CAD/MI or CVA.      Review of Systems  Constitutional: Negative.   HENT: Negative.   Respiratory: Negative.   Cardiovascular: Negative.  Negative for chest pain and orthopnea.  Genitourinary: Negative.   Neurological: Negative.   Psychiatric/Behavioral: Negative.   All other systems reviewed and are negative.      Objective:   Physical Exam  Constitutional: She is oriented to person, place, and time. She appears well-developed and well-nourished.  HENT:  Nose: Nose normal.  Mouth/Throat: Oropharynx is clear and moist.  Eyes: EOM are normal.  Neck: Trachea normal, normal range of motion and full passive range of motion without pain. Neck supple. No JVD present. Carotid bruit is not present. No thyromegaly present.  Cardiovascular: Normal rate, normal heart sounds and intact distal pulses.  Exam reveals no gallop and no friction rub.   No murmur heard. Regular irregular heart beat  Pulmonary/Chest: Effort normal and breath sounds normal.  Abdominal: Soft. Bowel sounds are normal. She exhibits no distension and no mass. There is no tenderness.  Musculoskeletal: Normal range of motion.  Lymphadenopathy:    She has no cervical adenopathy.  Neurological: She is alert and oriented to person, place, and time. She has normal reflexes.  Skin: Skin is warm and dry.  Psychiatric: She has a normal mood and affect. Her behavior is normal. Judgment and thought content normal.    BP (!) 144/82 (BP Location: Left Arm, Cuff Size: Normal)   Pulse 85   Temp 98 F (36.7 C) (Oral)   Ht 5' (1.524 m)   Wt 122 lb (55.3  kg)   BMI 23.83 kg/m       Assessment & Plan:   1. Essential hypertension Low sodium diet - CMP14+EGFR  2. Hyperlipidemia with target LDL less than 100 Low fat diet - Lipid panel    Labs pending Health maintenance reviewed Diet and exercise encouraged Continue  all meds Follow up  In 6 months   [place], FNP

## 2016-07-09 NOTE — Patient Instructions (Signed)
Fall Prevention in the Home Introduction Falls can cause injuries. They can happen to people of all ages. There are many things you can do to make your home safe and to help prevent falls. What can I do on the outside of my home?  Regularly fix the edges of walkways and driveways and fix any cracks.  Remove anything that might make you trip as you walk through a door, such as a raised step or threshold.  Trim any bushes or trees on the path to your home.  Use bright outdoor lighting.  Clear any walking paths of anything that might make someone trip, such as rocks or tools.  Regularly check to see if handrails are loose or broken. Make sure that both sides of any steps have handrails.  Any raised decks and porches should have guardrails on the edges.  Have any leaves, snow, or ice cleared regularly.  Use sand or salt on walking paths during winter.  Clean up any spills in your garage right away. This includes oil or grease spills. What can I do in the bathroom?  Use night lights.  Install grab bars by the toilet and in the tub and shower. Do not use towel bars as grab bars.  Use non-skid mats or decals in the tub or shower.  If you need to sit down in the shower, use a plastic, non-slip stool.  Keep the floor dry. Clean up any water that spills on the floor as soon as it happens.  Remove soap buildup in the tub or shower regularly.  Attach bath mats securely with double-sided non-slip rug tape.  Do not have throw rugs and other things on the floor that can make you trip. What can I do in the bedroom?  Use night lights.  Make sure that you have a light by your bed that is easy to reach.  Do not use any sheets or blankets that are too big for your bed. They should not hang down onto the floor.  Have a firm chair that has side arms. You can use this for support while you get dressed.  Do not have throw rugs and other things on the floor that can make you trip. What can  I do in the kitchen?  Clean up any spills right away.  Avoid walking on wet floors.  Keep items that you use a lot in easy-to-reach places.  If you need to reach something above you, use a strong step stool that has a grab bar.  Keep electrical cords out of the way.  Do not use floor polish or wax that makes floors slippery. If you must use wax, use non-skid floor wax.  Do not have throw rugs and other things on the floor that can make you trip. What can I do with my stairs?  Do not leave any items on the stairs.  Make sure that there are handrails on both sides of the stairs and use them. Fix handrails that are broken or loose. Make sure that handrails are as long as the stairways.  Check any carpeting to make sure that it is firmly attached to the stairs. Fix any carpet that is loose or worn.  Avoid having throw rugs at the top or bottom of the stairs. If you do have throw rugs, attach them to the floor with carpet tape.  Make sure that you have a light switch at the top of the stairs and the bottom of the stairs. If you   do not have them, ask someone to add them for you. What else can I do to help prevent falls?  Wear shoes that:  Do not have high heels.  Have rubber bottoms.  Are comfortable and fit you well.  Are closed at the toe. Do not wear sandals.  If you use a stepladder:  Make sure that it is fully opened. Do not climb a closed stepladder.  Make sure that both sides of the stepladder are locked into place.  Ask someone to hold it for you, if possible.  Clearly mark and make sure that you can see:  Any grab bars or handrails.  First and last steps.  Where the edge of each step is.  Use tools that help you move around (mobility aids) if they are needed. These include:  Canes.  Walkers.  Scooters.  Crutches.  Turn on the lights when you go into a dark area. Replace any light bulbs as soon as they burn out.  Set up your furniture so you have a  clear path. Avoid moving your furniture around.  If any of your floors are uneven, fix them.  If there are any pets around you, be aware of where they are.  Review your medicines with your doctor. Some medicines can make you feel dizzy. This can increase your chance of falling. Ask your doctor what other things that you can do to help prevent falls. This information is not intended to replace advice given to you by your health care provider. Make sure you discuss any questions you have with your health care provider. Document Released: 06/08/2009 Document Revised: 01/18/2016 Document Reviewed: 09/16/2014  2017 Elsevier  

## 2016-07-15 ENCOUNTER — Telehealth: Payer: Self-pay | Admitting: Nurse Practitioner

## 2016-07-15 ENCOUNTER — Encounter: Payer: Self-pay | Admitting: Nurse Practitioner

## 2016-07-15 NOTE — Telephone Encounter (Signed)
Patient did not have labs drawn as ordered at last visit

## 2016-07-15 NOTE — Telephone Encounter (Signed)
Left message , please come by office to have lab work drawn.  It was ordered at your office visit.

## 2016-07-23 ENCOUNTER — Other Ambulatory Visit: Payer: MEDICARE

## 2016-07-23 DIAGNOSIS — E785 Hyperlipidemia, unspecified: Secondary | ICD-10-CM | POA: Diagnosis not present

## 2016-07-23 DIAGNOSIS — I10 Essential (primary) hypertension: Secondary | ICD-10-CM | POA: Diagnosis not present

## 2016-07-24 LAB — CMP14+EGFR
ALBUMIN: 4.4 g/dL (ref 3.5–4.7)
ALT: 19 IU/L (ref 0–32)
AST: 32 IU/L (ref 0–40)
Albumin/Globulin Ratio: 1.5 (ref 1.2–2.2)
Alkaline Phosphatase: 53 IU/L (ref 39–117)
BUN / CREAT RATIO: 22 (ref 12–28)
BUN: 17 mg/dL (ref 8–27)
Bilirubin Total: 0.5 mg/dL (ref 0.0–1.2)
CALCIUM: 9.6 mg/dL (ref 8.7–10.3)
CHLORIDE: 98 mmol/L (ref 96–106)
CO2: 28 mmol/L (ref 18–29)
CREATININE: 0.78 mg/dL (ref 0.57–1.00)
GFR calc non Af Amer: 68 mL/min/{1.73_m2} (ref >59)
GFR, EST AFRICAN AMERICAN: 78 mL/min/{1.73_m2} (ref >59)
GLUCOSE: 98 mg/dL (ref 65–99)
Globulin, Total: 2.9 g/dL (ref 1.5–4.5)
Potassium: 4.7 mmol/L (ref 3.5–5.2)
Sodium: 142 mmol/L (ref 134–144)
TOTAL PROTEIN: 7.3 g/dL (ref 6.0–8.5)

## 2016-07-24 LAB — LIPID PANEL
Chol/HDL Ratio: 2.9 ratio units (ref 0.0–4.4)
Cholesterol, Total: 184 mg/dL (ref 100–199)
HDL: 63 mg/dL (ref >39)
LDL CALC: 102 mg/dL — AB (ref 0–99)
Triglycerides: 97 mg/dL (ref 0–149)
VLDL CHOLESTEROL CAL: 19 mg/dL (ref 5–40)

## 2016-08-14 DIAGNOSIS — Z85828 Personal history of other malignant neoplasm of skin: Secondary | ICD-10-CM | POA: Diagnosis not present

## 2016-08-14 DIAGNOSIS — L570 Actinic keratosis: Secondary | ICD-10-CM | POA: Diagnosis not present

## 2016-08-28 ENCOUNTER — Ambulatory Visit (INDEPENDENT_AMBULATORY_CARE_PROVIDER_SITE_OTHER): Payer: MEDICARE

## 2016-08-28 ENCOUNTER — Ambulatory Visit (INDEPENDENT_AMBULATORY_CARE_PROVIDER_SITE_OTHER): Payer: MEDICARE | Admitting: Nurse Practitioner

## 2016-08-28 ENCOUNTER — Encounter: Payer: Self-pay | Admitting: Nurse Practitioner

## 2016-08-28 VITALS — BP 142/86 | HR 95 | Temp 97.3°F | Ht 60.0 in | Wt 121.0 lb

## 2016-08-28 DIAGNOSIS — M533 Sacrococcygeal disorders, not elsewhere classified: Secondary | ICD-10-CM

## 2016-08-28 DIAGNOSIS — S322XXA Fracture of coccyx, initial encounter for closed fracture: Secondary | ICD-10-CM | POA: Diagnosis not present

## 2016-08-28 NOTE — Progress Notes (Signed)
   Subjective:    Patient ID: Morgan Spears, female    DOB: March 25, 1925, 83 y.o.   MRN: [id number]  HPI Patient comes in today saying that she was dizzy on thanksgiving and she slid out of a chair onto her tail bone in the floor- sinc e then her tail bone has been hurting. Very painful to sit down.    Review of Systems  Constitutional: Negative.   HENT: Negative.   Respiratory: Negative.   Cardiovascular: Negative.   Genitourinary: Negative.   Musculoskeletal:       Tail bone pain  Neurological: Positive for dizziness (but has resolved).  Psychiatric/Behavioral: Negative.   All other systems reviewed and are negative.      Objective:   Physical Exam  Constitutional: She is oriented to person, place, and time. She appears well-developed and well-nourished. No distress.  Cardiovascular: Normal rate.   Pulmonary/Chest: Effort normal and breath sounds normal.  Musculoskeletal:  Patient unable to sit with out leaning to one side.  Neurological: She is alert and oriented to person, place, and time. She has normal reflexes.  Skin: Skin is warm.  Psychiatric: She has a normal mood and affect. Her behavior is normal. Judgment and thought content normal.    BP (!) 142/86 (BP Location: Left Arm, Patient Position: Sitting, Cuff Size: Small)   Pulse 95   Temp 97.3 F (36.3 C) (Oral)   Ht 5' (1.524 m)   Wt 121 lb (54.9 kg)   BMI 23.63 kg/m   Xray- fracture coccyx- Preliminary reading by Ronnald Collum, FNP  [facility]      Assessment & Plan:   1. Coccyx pain   2. Closed fracture of coccyx, initial encounter ([place])    Tylenol OTC alternating with ibuprofen for pain Sit on donut RTO prn  Mary-Margaret Hassell Done, FNP

## 2016-08-28 NOTE — Patient Instructions (Signed)
Tailbone Injury The tailbone is the small bone at the lower end of the backbone (spine). You may have stretched tissues, bruises, or a broken bone (fracture). These injuries can be painful. Most tailbone injuries get better on their own in 4-6 weeks. Follow these instructions at home:  Take medicines only as told by your doctor.  If told, apply ice to the injured area.  Put ice in a plastic bag.  Place a towel between your skin and the bag.  Leave the ice on for 20 minutes, 2-3 times per day. Do this for the first 1-2 days.  Sit on a large, rubber or inflated ring or cushion to lessen pain. Lean forward when you sit to help lessen pain.  Avoid sitting in one place for a long time.  Increase your activity as the pain allows.  Do exercises as told by your doctor or physical therapist.  If it is painful to poop, take medicine to help you poop (stool softeners) as told by your doctor.  Eat foods that have plenty of fiber.  Keep all follow-up visits as told by your doctor. This is important. Contact a doctor if:  Your pain gets worse.  Pooping causes you pain.  You cannot poop (constipation).  You are leaking pee (urinary incontinence).  You have a fever. This information is not intended to replace advice given to you by your health care provider. Make sure you discuss any questions you have with your health care provider. Document Released: 09/14/2010 Document Revised: 04/11/2016 Document Reviewed: 08/08/2014 Elsevier Interactive Patient Education  2017 [employer].

## 2016-09-13 ENCOUNTER — Other Ambulatory Visit: Payer: Self-pay | Admitting: Nurse Practitioner

## 2016-10-28 ENCOUNTER — Other Ambulatory Visit: Payer: Self-pay | Admitting: Nurse Practitioner

## 2016-10-28 DIAGNOSIS — E785 Hyperlipidemia, unspecified: Secondary | ICD-10-CM

## 2016-11-30 ENCOUNTER — Other Ambulatory Visit: Payer: Self-pay | Admitting: Nurse Practitioner

## 2016-11-30 DIAGNOSIS — E785 Hyperlipidemia, unspecified: Secondary | ICD-10-CM

## 2017-01-03 ENCOUNTER — Other Ambulatory Visit: Payer: Self-pay | Admitting: Nurse Practitioner

## 2017-01-03 DIAGNOSIS — I10 Essential (primary) hypertension: Secondary | ICD-10-CM

## 2017-01-04 ENCOUNTER — Other Ambulatory Visit: Payer: Self-pay | Admitting: Nurse Practitioner

## 2017-01-06 ENCOUNTER — Encounter: Payer: Self-pay | Admitting: Nurse Practitioner

## 2017-01-06 ENCOUNTER — Ambulatory Visit (INDEPENDENT_AMBULATORY_CARE_PROVIDER_SITE_OTHER): Payer: MEDICARE | Admitting: Nurse Practitioner

## 2017-01-06 VITALS — BP 133/65 | HR 79 | Temp 98.2°F | Ht 60.0 in | Wt 115.4 lb

## 2017-01-06 DIAGNOSIS — M8580 Other specified disorders of bone density and structure, unspecified site: Secondary | ICD-10-CM | POA: Diagnosis not present

## 2017-01-06 DIAGNOSIS — I10 Essential (primary) hypertension: Secondary | ICD-10-CM | POA: Diagnosis not present

## 2017-01-06 DIAGNOSIS — E785 Hyperlipidemia, unspecified: Secondary | ICD-10-CM

## 2017-01-06 MED ORDER — ROSUVASTATIN CALCIUM 10 MG PO TABS: ORAL_TABLET | ORAL | 1 refills | Status: DC

## 2017-01-06 MED ORDER — LISINOPRIL-HYDROCHLOROTHIAZIDE 10-12.5 MG PO TABS: ORAL_TABLET | ORAL | 1 refills | Status: DC

## 2017-01-06 NOTE — Progress Notes (Signed)
   Subjective:    Patient ID: Loreta Ave, female    DOB: February 19, 1925, 83 y.o.   MRN: [id number]  HPI Kania Regnier is here today for follow up of chronic medical problem.  Outpatient Encounter Prescriptions as of 01/06/2017  Medication Sig  . ADVAIR DISKUS 100-50 MCG/DOSE AEPB USE 1 INHALATION TWICE DAILY  . aspirin 81 MG EC tablet Take 81 mg by mouth daily.    . Calcium Carbonate-Vitamin D (CALCIUM 600 + D PO) Take 1 capsule by mouth daily.   . Cholecalciferol (VITAMIN D3) 1000 UNITS CAPS Take 1 capsule by mouth daily.    Marland Kitchen geriatric multivitamins-minerals (ELDERTONIC/GEVRABON) ELIX Take 15 mLs by mouth daily.    Marland Kitchen lisinopril-hydrochlorothiazide (PRINZIDE,ZESTORETIC) 10-12.5 MG tablet TAKE ONE (1) TABLET EACH DAY  . rosuvastatin (CRESTOR) 10 MG tablet TAKE ONE (1) TABLET EACH DAY   No facility-administered encounter medications on file as of 01/06/2017.     1. Essential hypertension  Patient taking Prinzide for blood pressure management.  2. Osteopenia, unspecified location  Patient taking calcium supplement and vitamin D.  3. Hyperlipidemia with target LDL less than 100  Patient managed with rosuvastatin.    New complaints: None today.    Review of Systems  Constitutional: Negative for activity change, appetite change and fatigue.  Respiratory: Negative for cough and shortness of breath.   Cardiovascular: Negative for chest pain.  Gastrointestinal: Negative for abdominal distention and abdominal pain.  Genitourinary: Negative for difficulty urinating, dysuria and frequency.  Neurological: Negative for dizziness and headaches.  All other systems reviewed and are negative.      Objective:   Physical Exam  Constitutional: She is oriented to person, place, and time. She appears well-developed and well-nourished.  HENT:  Head: Normocephalic.  Mouth/Throat: Oropharynx is clear and moist.  Eyes: Pupils are equal, round, and reactive to light.  Neck: Normal range of  motion. Neck supple.  Cardiovascular: Normal rate, regular rhythm and normal heart sounds.   Pulmonary/Chest: Effort normal and breath sounds normal.  Abdominal: Soft. Bowel sounds are normal.  Musculoskeletal: Normal range of motion.  Neurological: She is alert and oriented to person, place, and time.  Skin: Skin is warm and dry.  Psychiatric: She has a normal mood and affect. Her behavior is normal. Judgment and thought content normal.    BP 133/65   Pulse 79   Temp 98.2 F (36.8 C) (Oral)   Ht 5' (1.524 m)   Wt 115 lb 6.4 oz (52.3 kg)   BMI 22.54 kg/m      Assessment & Plan:    1. Essential hypertension Low salt diet. - CMP14+EGFR - lisinopril-hydrochlorothiazide (PRINZIDE,ZESTORETIC) 10-12.5 MG tablet; TAKE ONE (1) TABLET EACH DAY  Dispense: 90 tablet; Refill: 1  2. Osteopenia, unspecified location Continue calcium and vitamin D supplementation.  3. Hyperlipidemia with target LDL less than 100 Low fat diet. - Lipid panel - rosuvastatin (CRESTOR) 10 MG tablet; TAKE ONE (1) TABLET EACH DAY  Dispense: 90 tablet; Refill: 1    Labs pending Health maintenance reviewed Diet and exercise encouraged Continue all meds Follow up  In 3 months.   Adaleah Forget, SNP Mary-Margaret Hassell Done, FNP

## 2017-01-06 NOTE — Patient Instructions (Signed)
Fall Prevention in the Home Falls can cause injuries. They can happen to people of all ages. There are many things you can do to make your home safe and to help prevent falls. What can I do on the outside of my home?  Regularly fix the edges of walkways and driveways and fix any cracks.  Remove anything that might make you trip as you walk through a door, such as a raised step or threshold.  Trim any bushes or trees on the path to your home.  Use bright outdoor lighting.  Clear any walking paths of anything that might make someone trip, such as rocks or tools.  Regularly check to see if handrails are loose or broken. Make sure that both sides of any steps have handrails.  Any raised decks and porches should have guardrails on the edges.  Have any leaves, snow, or ice cleared regularly.  Use sand or salt on walking paths during winter.  Clean up any spills in your garage right away. This includes oil or grease spills. What can I do in the bathroom?  Use night lights.  Install grab bars by the toilet and in the tub and shower. Do not use towel bars as grab bars.  Use non-skid mats or decals in the tub or shower.  If you need to sit down in the shower, use a plastic, non-slip stool.  Keep the floor dry. Clean up any water that spills on the floor as soon as it happens.  Remove soap buildup in the tub or shower regularly.  Attach bath mats securely with double-sided non-slip rug tape.  Do not have throw rugs and other things on the floor that can make you trip. What can I do in the bedroom?  Use night lights.  Make sure that you have a light by your bed that is easy to reach.  Do not use any sheets or blankets that are too big for your bed. They should not hang down onto the floor.  Have a firm chair that has side arms. You can use this for support while you get dressed.  Do not have throw rugs and other things on the floor that can make you trip. What can I do in the  kitchen?  Clean up any spills right away.  Avoid walking on wet floors.  Keep items that you use a lot in easy-to-reach places.  If you need to reach something above you, use a strong step stool that has a grab bar.  Keep electrical cords out of the way.  Do not use floor polish or wax that makes floors slippery. If you must use wax, use non-skid floor wax.  Do not have throw rugs and other things on the floor that can make you trip. What can I do with my stairs?  Do not leave any items on the stairs.  Make sure that there are handrails on both sides of the stairs and use them. Fix handrails that are broken or loose. Make sure that handrails are as long as the stairways.  Check any carpeting to make sure that it is firmly attached to the stairs. Fix any carpet that is loose or worn.  Avoid having throw rugs at the top or bottom of the stairs. If you do have throw rugs, attach them to the floor with carpet tape.  Make sure that you have a light switch at the top of the stairs and the bottom of the stairs. If you do   not have them, ask someone to add them for you. What else can I do to help prevent falls?  Wear shoes that:  Do not have high heels.  Have rubber bottoms.  Are comfortable and fit you well.  Are closed at the toe. Do not wear sandals.  If you use a stepladder:  Make sure that it is fully opened. Do not climb a closed stepladder.  Make sure that both sides of the stepladder are locked into place.  Ask someone to hold it for you, if possible.  Clearly mark and make sure that you can see:  Any grab bars or handrails.  First and last steps.  Where the edge of each step is.  Use tools that help you move around (mobility aids) if they are needed. These include:  Canes.  Walkers.  Scooters.  Crutches.  Turn on the lights when you go into a dark area. Replace any light bulbs as soon as they burn out.  Set up your furniture so you have a clear path.  Avoid moving your furniture around.  If any of your floors are uneven, fix them.  If there are any pets around you, be aware of where they are.  Review your medicines with your doctor. Some medicines can make you feel dizzy. This can increase your chance of falling. Ask your doctor what other things that you can do to help prevent falls. This information is not intended to replace advice given to you by your health care provider. Make sure you discuss any questions you have with your health care provider. Document Released: 06/08/2009 Document Revised: 01/18/2016 Document Reviewed: 09/16/2014 Elsevier Interactive Patient Education  2017 Else[employer] Inc[employer]  

## 2017-01-07 LAB — CMP14+EGFR
A/G RATIO: 1.7 (ref 1.2–2.2)
ALT: 13 IU/L (ref 0–32)
AST: 25 IU/L (ref 0–40)
Albumin: 4.3 g/dL (ref 3.5–4.7)
Alkaline Phosphatase: 45 IU/L (ref 39–117)
BUN/Creatinine Ratio: 22 (ref 12–28)
BUN: 24 mg/dL (ref 8–27)
Bilirubin Total: 0.5 mg/dL (ref 0.0–1.2)
CALCIUM: 9.8 mg/dL (ref 8.7–10.3)
CO2: 26 mmol/L (ref 18–29)
Chloride: 99 mmol/L (ref 96–106)
Creatinine, Ser: 1.07 mg/dL — ABNORMAL HIGH (ref 0.57–1.00)
GFR, EST AFRICAN AMERICAN: 53 mL/min/{1.73_m2} — AB (ref >59)
GFR, EST NON AFRICAN AMERICAN: 46 mL/min/{1.73_m2} — AB (ref >59)
GLOBULIN, TOTAL: 2.6 g/dL (ref 1.5–4.5)
Glucose: 82 mg/dL (ref 65–99)
POTASSIUM: 5 mmol/L (ref 3.5–5.2)
Sodium: 140 mmol/L (ref 134–144)
TOTAL PROTEIN: 6.9 g/dL (ref 6.0–8.5)

## 2017-01-07 LAB — LIPID PANEL
CHOL/HDL RATIO: 2.7 ratio (ref 0.0–4.4)
Cholesterol, Total: 153 mg/dL (ref 100–199)
HDL: 57 mg/dL (ref >39)
LDL Calculated: 79 mg/dL (ref 0–99)
Triglycerides: 87 mg/dL (ref 0–149)
VLDL Cholesterol Cal: 17 mg/dL (ref 5–40)

## 2017-01-20 IMAGING — DX DG SACRUM/COCCYX 2+V
3 series · 3 of 3 positions shown · non-contrast
Comparison: No recent prior.

CLINICAL DATA: Pain.

EXAM:
SACRUM AND COCCYX - 2+ VIEW

[coccyx ap]
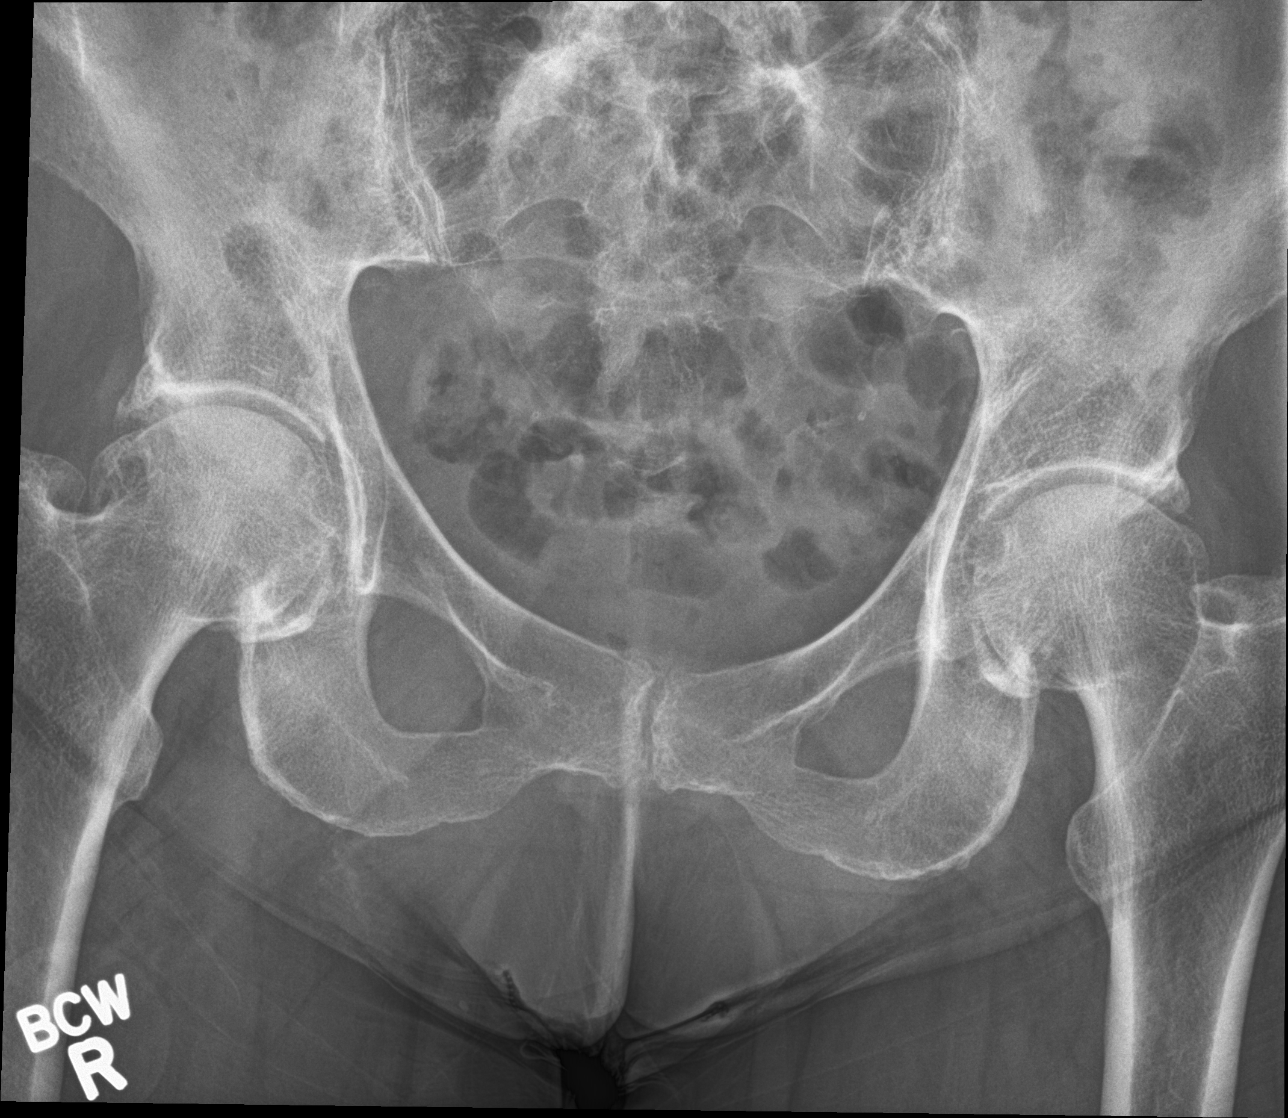

[sacrum lat]
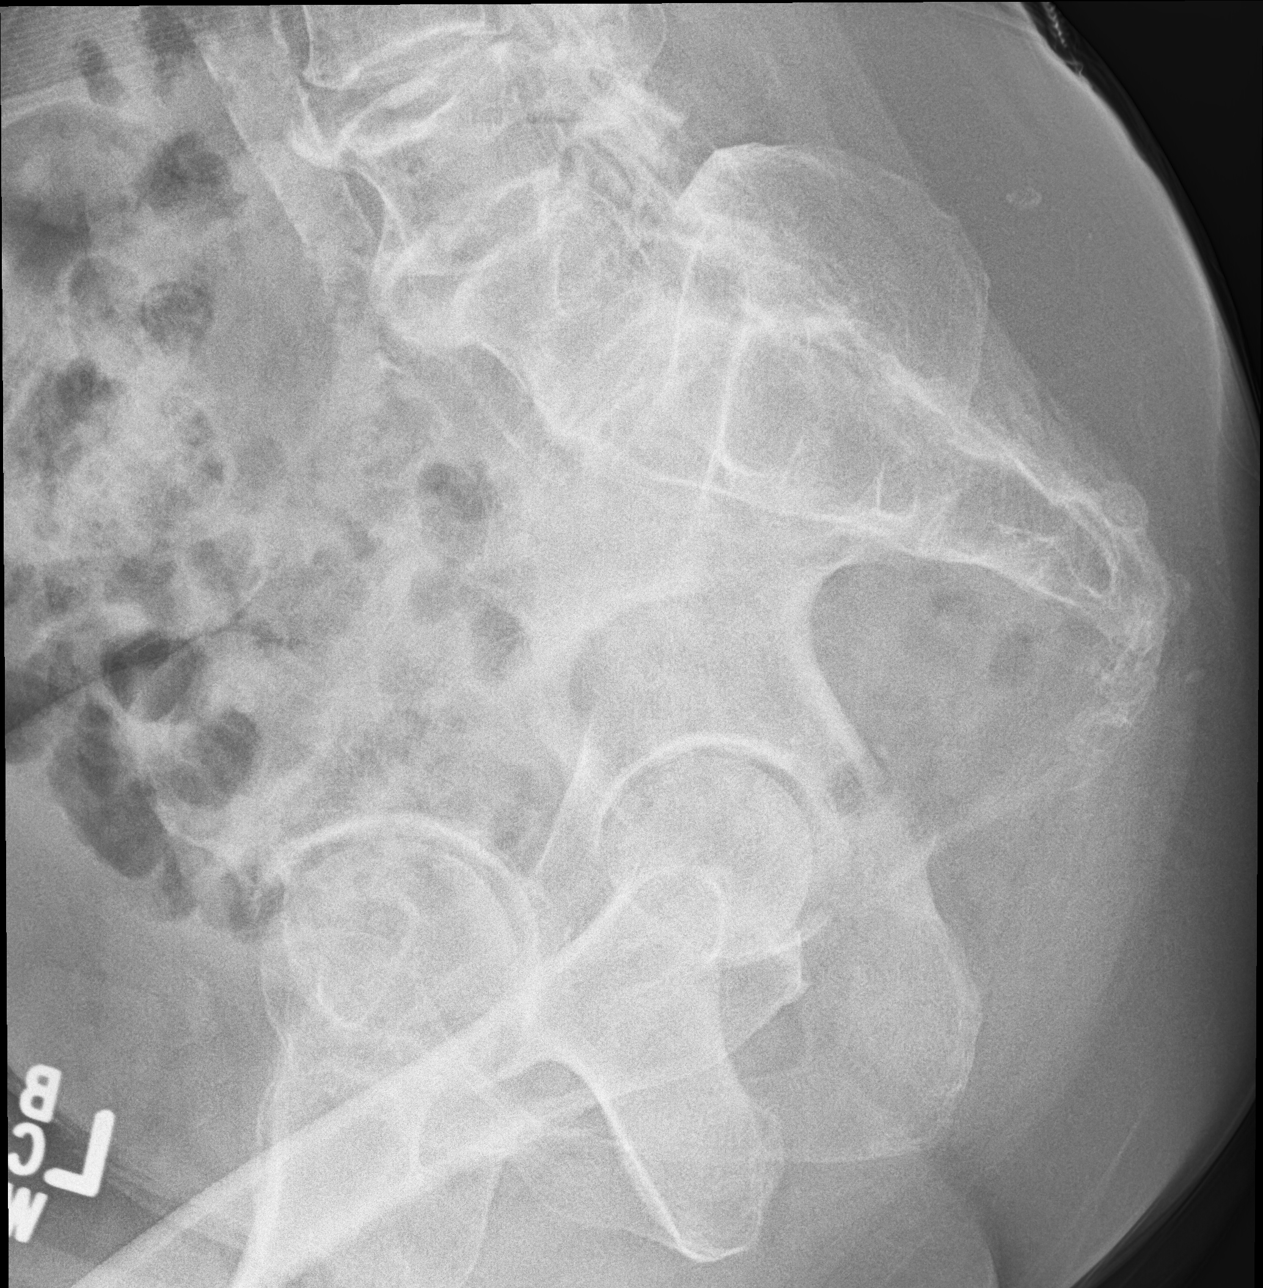

[sacrum ap]
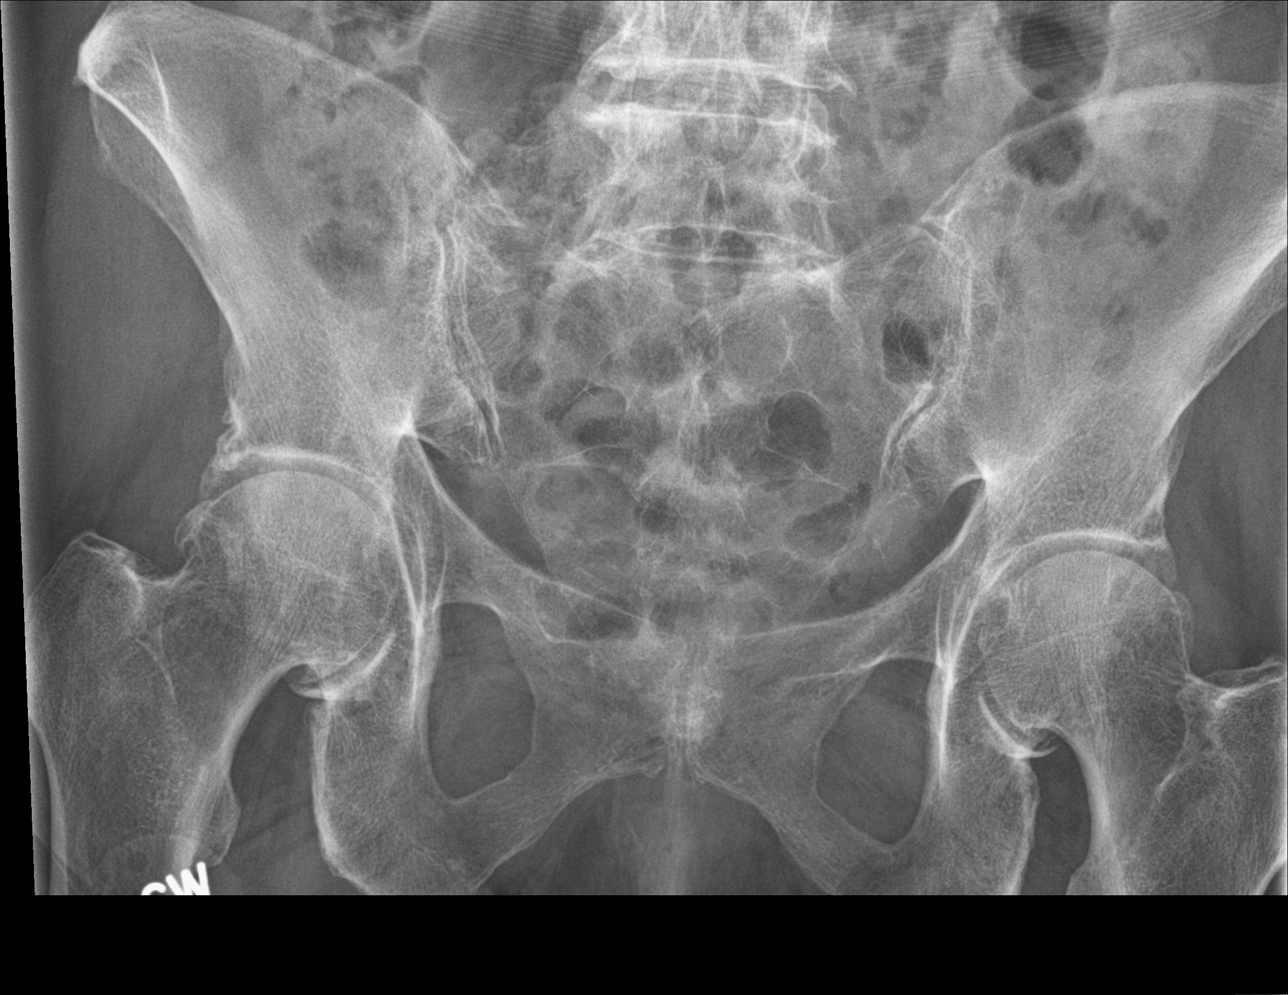

[3 of 3 positions shown; findings below may reference images not displayed]

FINDINGS: No acute bony abnormality identified. No evidence of fracture or
dislocation. Soft tissue calcifications noted. These may represent
injection granulomas.
IMPRESSION: Diffuse osteopenia degenerative change. No acute abnormality
identified.

## 2017-02-05 ENCOUNTER — Other Ambulatory Visit: Payer: Self-pay | Admitting: Nurse Practitioner

## 2017-02-19 ENCOUNTER — Ambulatory Visit (INDEPENDENT_AMBULATORY_CARE_PROVIDER_SITE_OTHER): Payer: MEDICARE | Admitting: Physician Assistant

## 2017-02-19 ENCOUNTER — Encounter: Payer: Self-pay | Admitting: Physician Assistant

## 2017-02-19 VITALS — BP 149/61 | HR 89 | Temp 97.1°F | Ht 60.0 in | Wt 111.0 lb

## 2017-02-19 DIAGNOSIS — L309 Dermatitis, unspecified: Secondary | ICD-10-CM | POA: Diagnosis not present

## 2017-02-19 NOTE — Progress Notes (Signed)
Subjective:     Patient ID: Morgan Spears, female   DOB: [birth year], 83 y.o.   MRN: [id number]  HPI Pruritic rash to the upper body for several days Using OTC cream  Review of Systems Denies any pain or drainage form the sites + erythema and edema    Objective:   Physical Exam Several erythem isolated lesions to the R upper arm, L upper arm, L lower arm, and L upper chest All with surrounding erythema but no induration No TTP No vesicles or ulcerations seen     Assessment:     1. Dermatitis         Plan:     Cool compresses OTC Hydrocort OTC Claritin Reviewed S/S of infection F/U immed if any sx present

## 2017-02-19 NOTE — Patient Instructions (Signed)
Contact Dermatitis Dermatitis is redness, soreness, and swelling (inflammation) of the skin. Contact dermatitis is a reaction to certain substances that touch the skin. You either touched something that irritated your skin, or you have allergies to something you touched. Follow these instructions at home: Skin Care  Moisturize your skin as needed.  Apply cool compresses to the affected areas.  Try taking a bath with: ? Epsom salts. Follow the instructions on the package. You can get these at a pharmacy or grocery store. ? Baking soda. Pour a small amount into the bath as told by your doctor. ? Colloidal oatmeal. Follow the instructions on the package. You can get this at a pharmacy or grocery store.  Try applying baking soda paste to your skin. Stir water into baking soda until it looks like paste.  Do not scratch your skin.  Bathe less often.  Bathe in lukewarm water. Avoid using hot water. Medicines  Take or apply over-the-counter and prescription medicines only as told by your doctor.  If you were prescribed an antibiotic medicine, take or apply your antibiotic as told by your doctor. Do not stop taking the antibiotic even if your condition starts to get better. General instructions  Keep all follow-up visits as told by your doctor. This is important.  Avoid the substance that caused your reaction. If you do not know what caused it, keep a journal to try to track what caused it. Write down: ? What you eat. ? What cosmetic products you use. ? What you drink. ? What you wear in the affected area. This includes jewelry.  If you were given a bandage (dressing), take care of it as told by your doctor. This includes when to change and remove it. Contact a doctor if:  You do not get better with treatment.  Your condition gets worse.  You have signs of infection such as: ? Swelling. ? Tenderness. ? Redness. ? Soreness. ? Warmth.  You have a fever.  You have new  symptoms. Get help right away if:  You have a very bad headache.  You have neck pain.  Your neck is stiff.  You throw up (vomit).  You feel very sleepy.  You see red streaks coming from the affected area.  Your bone or joint underneath the affected area becomes painful after the skin has healed.  The affected area turns darker.  You have trouble breathing. This information is not intended to replace advice given to you by your health care provider. Make sure you discuss any questions you have with your health care provider. Document Released: 06/09/2009 Document Revised: 01/18/2016 Document Reviewed: 12/28/2014 Elsevier Interactive Patient Education  2018 [employer]  

## 2017-04-11 ENCOUNTER — Encounter: Payer: Self-pay | Admitting: Nurse Practitioner

## 2017-04-11 ENCOUNTER — Ambulatory Visit (INDEPENDENT_AMBULATORY_CARE_PROVIDER_SITE_OTHER): Payer: MEDICARE | Admitting: Nurse Practitioner

## 2017-04-11 VITALS — BP 156/70 | HR 75 | Temp 97.7°F | Ht 60.0 in | Wt 110.0 lb

## 2017-04-11 DIAGNOSIS — E785 Hyperlipidemia, unspecified: Secondary | ICD-10-CM

## 2017-04-11 DIAGNOSIS — I10 Essential (primary) hypertension: Secondary | ICD-10-CM | POA: Diagnosis not present

## 2017-04-11 DIAGNOSIS — J410 Simple chronic bronchitis: Secondary | ICD-10-CM

## 2017-04-11 DIAGNOSIS — M8580 Other specified disorders of bone density and structure, unspecified site: Secondary | ICD-10-CM | POA: Diagnosis not present

## 2017-04-11 MED ORDER — ROSUVASTATIN CALCIUM 10 MG PO TABS: ORAL_TABLET | ORAL | 1 refills | Status: DC

## 2017-04-11 MED ORDER — LISINOPRIL-HYDROCHLOROTHIAZIDE 10-12.5 MG PO TABS: ORAL_TABLET | ORAL | 1 refills | Status: DC

## 2017-04-11 MED ORDER — FLUTICASONE-SALMETEROL 100-50 MCG/DOSE IN AEPB: INHALATION_SPRAY | RESPIRATORY_TRACT | 5 refills | Status: DC

## 2017-04-11 NOTE — Progress Notes (Signed)
 Subjective:    Patient ID: Morgan Spears, female    DOB: 12/16/1924, 83 y.o.   MRN: [id number]  HPI  Morgan Spears is here today for follow up of chronic medical problem.  Outpatient Encounter Prescriptions as of 04/11/2017  Medication Sig  . ADVAIR DISKUS 100-50 MCG/DOSE AEPB USE 1 INHALATION TWICE DAILY  . aspirin 81 MG EC tablet Take 81 mg by mouth daily.    . Calcium Carbonate-Vitamin D (CALCIUM 600 + D PO) Take 1 capsule by mouth daily.   . Cholecalciferol (VITAMIN D3) 1000 UNITS CAPS Take 1 capsule by mouth daily.    . geriatric multivitamins-minerals (ELDERTONIC/GEVRABON) ELIX Take 15 mLs by mouth daily.    . lisinopril-hydrochlorothiazide (PRINZIDE,ZESTORETIC) 10-12.5 MG tablet TAKE ONE (1) TABLET EACH DAY  . rosuvastatin (CRESTOR) 10 MG tablet TAKE ONE (1) TABLET EACH DAY   No facility-administered encounter medications on file as of 04/11/2017.     1. Essential hypertension  No chest pain,SOB or headache. Does not check blood pressure at home  2. Osteopenia, unspecified location  Does weight bearing exercises as she can throughout week  3. Hyperlipidemia with target LDL less than 100  Does not eat many fried foods  4.      COPD          Uses advair BID, she does not have need for albiterol   New complaints: None today  Social history: Lives alone and daughter checks  On her 5x a week. She has life alert if needed.    Review of Systems  Constitutional: Negative for activity change and appetite change.  HENT: Negative.   Eyes: Negative for pain.  Respiratory: Negative for shortness of breath.   Cardiovascular: Negative for chest pain, palpitations and leg swelling.  Gastrointestinal: Negative for abdominal pain.  Endocrine: Negative for polydipsia.  Genitourinary: Negative.   Skin: Negative for rash.  Neurological: Negative for dizziness, weakness and headaches.  Hematological: Does not bruise/bleed easily.  Psychiatric/Behavioral: Negative.   All  other systems reviewed and are negative.      Objective:   Physical Exam  Constitutional: She is oriented to person, place, and time. She appears well-developed and well-nourished.  HENT:  Nose: Nose normal.  Mouth/Throat: Oropharynx is clear and moist.  Eyes: EOM are normal.  Neck: Trachea normal, normal range of motion and full passive range of motion without pain. Neck supple. No JVD present. Carotid bruit is not present. No thyromegaly present.  Cardiovascular: Normal rate, regular rhythm, normal heart sounds and intact distal pulses.  Exam reveals no gallop and no friction rub.   No murmur heard. Pulmonary/Chest: Effort normal and breath sounds normal.  Abdominal: Soft. Bowel sounds are normal. She exhibits no distension and no mass. There is no tenderness.  Musculoskeletal: Normal range of motion.  Lymphadenopathy:    She has no cervical adenopathy.  Neurological: She is alert and oriented to person, place, and time. She has normal reflexes.  Skin: Skin is warm and dry.  Psychiatric: She has a normal mood and affect. Her behavior is normal. Judgment and thought content normal.    BP (!) 156/70   Pulse 75   Temp 97.7 F (36.5 C) (Oral)   Ht 5' (1.524 m)   Wt 110 lb (49.9 kg)   BMI 21.48 kg/m         Assessment & Plan:  1. Essential hypertension Low sodium diet - lisinopril-hydrochlorothiazide (PRINZIDE,ZESTORETIC) 10-12.5 MG tablet; TAKE ONE (1) TABLET EACH DAY  Dispense: 90   tablet; Refill: 1 - CMP14+EGFR  2. Osteopenia, unspecified location Weight bearing exercises as can tolerate  3. Hyperlipidemia with target LDL less than 100 Continue low fta diet - rosuvastatin (CRESTOR) 10 MG tablet; TAKE ONE (1) TABLET EACH DAY  Dispense: 90 tablet; Refill: 1 - Lipid panel  4. Simple chronic bronchitis (HCC) - Fluticasone-Salmeterol (ADVAIR DISKUS) 100-50 MCG/DOSE AEPB; USE 1 INHALATION TWICE DAILY  Dispense: 60 each; Refill: 5    Labs pending Health maintenance  reviewed Diet and exercise encouraged Continue all meds Follow up  In 3 months   Morgan Martin, FNP   

## 2017-04-11 NOTE — Patient Instructions (Signed)

## 2017-04-12 LAB — LIPID PANEL
Chol/HDL Ratio: 2.7 ratio (ref 0.0–4.4)
Cholesterol, Total: 154 mg/dL (ref 100–199)
HDL: 57 mg/dL (ref >39)
LDL Calculated: 79 mg/dL (ref 0–99)
TRIGLYCERIDES: 91 mg/dL (ref 0–149)
VLDL Cholesterol Cal: 18 mg/dL (ref 5–40)

## 2017-04-12 LAB — CMP14+EGFR
A/G RATIO: 1.8 (ref 1.2–2.2)
ALBUMIN: 4.5 g/dL (ref 3.5–4.7)
ALK PHOS: 39 IU/L (ref 39–117)
ALT: 12 IU/L (ref 0–32)
AST: 27 IU/L (ref 0–40)
BILIRUBIN TOTAL: 0.3 mg/dL (ref 0.0–1.2)
BUN / CREAT RATIO: 18 (ref 12–28)
BUN: 17 mg/dL (ref 8–27)
CHLORIDE: 100 mmol/L (ref 96–106)
CO2: 22 mmol/L (ref 20–29)
Calcium: 9.9 mg/dL (ref 8.7–10.3)
Creatinine, Ser: 0.95 mg/dL (ref 0.57–1.00)
GFR calc Af Amer: 61 mL/min/{1.73_m2} (ref >59)
GFR calc non Af Amer: 53 mL/min/{1.73_m2} — ABNORMAL LOW (ref >59)
GLOBULIN, TOTAL: 2.5 g/dL (ref 1.5–4.5)
Glucose: 91 mg/dL (ref 65–99)
Potassium: 4.4 mmol/L (ref 3.5–5.2)
SODIUM: 138 mmol/L (ref 134–144)
Total Protein: 7 g/dL (ref 6.0–8.5)

## 2017-07-25 ENCOUNTER — Ambulatory Visit: Payer: MEDICARE | Admitting: Nurse Practitioner

## 2017-07-29 ENCOUNTER — Encounter: Payer: Self-pay | Admitting: Nurse Practitioner

## 2017-07-29 ENCOUNTER — Ambulatory Visit (INDEPENDENT_AMBULATORY_CARE_PROVIDER_SITE_OTHER): Payer: MEDICARE | Admitting: Nurse Practitioner

## 2017-07-29 VITALS — BP 138/60 | HR 84 | Temp 97.0°F | Ht 60.0 in | Wt 107.0 lb

## 2017-07-29 DIAGNOSIS — J410 Simple chronic bronchitis: Secondary | ICD-10-CM

## 2017-07-29 DIAGNOSIS — M8580 Other specified disorders of bone density and structure, unspecified site: Secondary | ICD-10-CM

## 2017-07-29 DIAGNOSIS — E785 Hyperlipidemia, unspecified: Secondary | ICD-10-CM | POA: Diagnosis not present

## 2017-07-29 DIAGNOSIS — I10 Essential (primary) hypertension: Secondary | ICD-10-CM

## 2017-07-29 DIAGNOSIS — Z23 Encounter for immunization: Secondary | ICD-10-CM

## 2017-07-29 LAB — CMP14+EGFR
ALT: 13 IU/L (ref 0–32)
AST: 30 IU/L (ref 0–40)
Albumin/Globulin Ratio: 1.6 (ref 1.2–2.2)
Albumin: 4.2 g/dL (ref 3.2–4.6)
Alkaline Phosphatase: 48 IU/L (ref 39–117)
BUN / CREAT RATIO: 23 (ref 12–28)
BUN: 21 mg/dL (ref 10–36)
Bilirubin Total: 0.5 mg/dL (ref 0.0–1.2)
CALCIUM: 9.8 mg/dL (ref 8.7–10.3)
CO2: 25 mmol/L (ref 20–29)
CREATININE: 0.91 mg/dL (ref 0.57–1.00)
Chloride: 101 mmol/L (ref 96–106)
GFR calc Af Amer: 64 mL/min/{1.73_m2} (ref >59)
GFR calc non Af Amer: 56 mL/min/{1.73_m2} — ABNORMAL LOW (ref >59)
GLOBULIN, TOTAL: 2.6 g/dL (ref 1.5–4.5)
Glucose: 70 mg/dL (ref 65–99)
Potassium: 4.8 mmol/L (ref 3.5–5.2)
SODIUM: 142 mmol/L (ref 134–144)
TOTAL PROTEIN: 6.8 g/dL (ref 6.0–8.5)

## 2017-07-29 LAB — LIPID PANEL
CHOL/HDL RATIO: 2.5 ratio (ref 0.0–4.4)
CHOLESTEROL TOTAL: 155 mg/dL (ref 100–199)
HDL: 62 mg/dL (ref >39)
LDL Calculated: 78 mg/dL (ref 0–99)
TRIGLYCERIDES: 75 mg/dL (ref 0–149)
VLDL Cholesterol Cal: 15 mg/dL (ref 5–40)

## 2017-07-29 MED ORDER — ROSUVASTATIN CALCIUM 10 MG PO TABS: ORAL_TABLET | ORAL | 1 refills | Status: DC

## 2017-07-29 MED ORDER — LISINOPRIL-HYDROCHLOROTHIAZIDE 10-12.5 MG PO TABS: ORAL_TABLET | ORAL | 1 refills | Status: DC

## 2017-07-29 MED ORDER — FLUTICASONE-SALMETEROL 100-50 MCG/DOSE IN AEPB: INHALATION_SPRAY | RESPIRATORY_TRACT | 5 refills | Status: DC

## 2017-07-29 NOTE — Patient Instructions (Signed)

## 2017-07-29 NOTE — Progress Notes (Signed)
Subjective:    Patient ID: Morgan Spears, female    DOB: 06-Aug-1925, 83 y.o.   MRN: [id number]  HPI  Morgan Spears is here today for follow up of chronic medical problem.  Outpatient Encounter Medications as of 07/29/2017  Medication Sig  . aspirin 81 MG EC tablet Take 81 mg by mouth daily.    . Calcium Carbonate-Vitamin D (CALCIUM 600 + D PO) Take 1 capsule by mouth daily.   . Cholecalciferol (VITAMIN D3) 1000 UNITS CAPS Take 1 capsule by mouth daily.    . Fluticasone-Salmeterol (ADVAIR DISKUS) 100-50 MCG/DOSE AEPB USE 1 INHALATION TWICE DAILY  . geriatric multivitamins-minerals (ELDERTONIC/GEVRABON) ELIX Take 15 mLs by mouth daily.    Marland Kitchen lisinopril-hydrochlorothiazide (PRINZIDE,ZESTORETIC) 10-12.5 MG tablet TAKE ONE (1) TABLET EACH DAY  . rosuvastatin (CRESTOR) 10 MG tablet TAKE ONE (1) TABLET EACH DAY    1. Essential hypertension  No c/o chest pain, sob or headaches. Does not check blood pressures at home. BP Readings from Last 3 Encounters:  07/29/17 138/60  04/11/17 (!) 156/70  02/19/17 (!) 149/61     2. Osteopenia, unspecified location  No c/o back pain.  3. Hyperlipidemia with target LDL less than 100  Tries to watch diet    New complaints: none  Social history: Still lives alone- daughter sees her often    Review of Systems  Constitutional: Negative for activity change and appetite change.  HENT: Negative.   Eyes: Negative for pain.  Respiratory: Negative for shortness of breath.   Cardiovascular: Negative for chest pain, palpitations and leg swelling.  Gastrointestinal: Negative for abdominal pain.  Endocrine: Negative for polydipsia.  Genitourinary: Negative.   Skin: Negative for rash.  Neurological: Negative for dizziness, weakness and headaches.  Hematological: Does not bruise/bleed easily.  Psychiatric/Behavioral: Negative.   All other systems reviewed and are negative.      Objective:   Physical Exam  Constitutional: She is oriented to  person, place, and time. She appears well-developed and well-nourished.  HENT:  Nose: Nose normal.  Mouth/Throat: Oropharynx is clear and moist.  Eyes: EOM are normal.  Neck: Trachea normal, normal range of motion and full passive range of motion without pain. Neck supple. No JVD present. Carotid bruit is not present. No thyromegaly present.  Cardiovascular: Normal rate, regular rhythm, normal heart sounds and intact distal pulses. Exam reveals no gallop and no friction rub.  No murmur heard. Pulmonary/Chest: Effort normal and breath sounds normal.  Abdominal: Soft. Bowel sounds are normal. She exhibits no distension and no mass. There is no tenderness.  Musculoskeletal: Normal range of motion.  Lymphadenopathy:    She has no cervical adenopathy.  Neurological: She is alert and oriented to person, place, and time. She has normal reflexes.  Skin: Skin is warm and dry.  Psychiatric: She has a normal mood and affect. Her behavior is normal. Judgment and thought content normal.   BP 138/60   Pulse 84   Temp (!) 97 F (36.1 C) (Oral)   Ht 5' (1.524 m)   Wt 107 lb (48.5 kg)   BMI 20.90 kg/m       Assessment & Plan:  1. Essential hypertension Low sodium diet - lisinopril-hydrochlorothiazide (PRINZIDE,ZESTORETIC) 10-12.5 MG tablet; TAKE ONE (1) TABLET EACH DAY  Dispense: 90 tablet; Refill: 1 - CMP14+EGFR  2. Osteopenia, unspecified location Weight bearing eercises  3. Hyperlipidemia with target LDL less than 100 Low fat diet - rosuvastatin (CRESTOR) 10 MG tablet; TAKE ONE (1) TABLET EACH DAY  Dispense: 90 tablet; Refill: 1 - Lipid panel  4. Simple chronic bronchitis (HCC) - Fluticasone-Salmeterol (ADVAIR DISKUS) 100-50 MCG/DOSE AEPB; USE 1 INHALATION TWICE DAILY  Dispense: 60 each; Refill: 5    Labs pending Health maintenance reviewed Diet and exercise encouraged Continue all meds Follow up  In 6 months   [place], FNP

## 2017-10-28 ENCOUNTER — Encounter: Payer: Self-pay | Admitting: Nurse Practitioner

## 2017-10-28 ENCOUNTER — Ambulatory Visit (INDEPENDENT_AMBULATORY_CARE_PROVIDER_SITE_OTHER): Payer: MEDICARE | Admitting: Nurse Practitioner

## 2017-10-28 VITALS — BP 125/81 | HR 133 | Temp 97.7°F | Ht 60.0 in | Wt 107.8 lb

## 2017-10-28 DIAGNOSIS — I10 Essential (primary) hypertension: Secondary | ICD-10-CM | POA: Diagnosis not present

## 2017-10-28 DIAGNOSIS — J410 Simple chronic bronchitis: Secondary | ICD-10-CM | POA: Diagnosis not present

## 2017-10-28 DIAGNOSIS — N952 Postmenopausal atrophic vaginitis: Secondary | ICD-10-CM

## 2017-10-28 DIAGNOSIS — E785 Hyperlipidemia, unspecified: Secondary | ICD-10-CM

## 2017-10-28 DIAGNOSIS — M8580 Other specified disorders of bone density and structure, unspecified site: Secondary | ICD-10-CM

## 2017-10-28 MED ORDER — LISINOPRIL-HYDROCHLOROTHIAZIDE 10-12.5 MG PO TABS: ORAL_TABLET | ORAL | 1 refills | Status: DC

## 2017-10-28 MED ORDER — ROSUVASTATIN CALCIUM 10 MG PO TABS: ORAL_TABLET | ORAL | 1 refills | Status: DC

## 2017-10-28 MED ORDER — FLUTICASONE-SALMETEROL 100-50 MCG/DOSE IN AEPB
INHALATION_SPRAY | RESPIRATORY_TRACT | 5 refills | Status: DC
Start: 2017-10-28 — End: 2018-08-17

## 2017-10-28 NOTE — Patient Instructions (Signed)

## 2017-10-28 NOTE — Progress Notes (Signed)
Subjective:    Patient ID: Morgan Spears, female    DOB: 1925-02-11, 84 y.o.   MRN: [id number]  HPI   Ahri Olson is here today for follow up of chronic medical problem. She is accompanied by her daughter today  Outpatient Encounter Medications as of 10/28/2017  Medication Sig  . aspirin 81 MG EC tablet Take 81 mg by mouth daily.    . Calcium Carbonate-Vitamin D (CALCIUM 600 + D PO) Take 1 capsule by mouth daily.   . Cholecalciferol (VITAMIN D3) 1000 UNITS CAPS Take 1 capsule by mouth daily.    . Fluticasone-Salmeterol (ADVAIR DISKUS) 100-50 MCG/DOSE AEPB USE 1 INHALATION TWICE DAILY  . geriatric multivitamins-minerals (ELDERTONIC/GEVRABON) ELIX Take 15 mLs by mouth daily.    Marland Kitchen lisinopril-hydrochlorothiazide (PRINZIDE,ZESTORETIC) 10-12.5 MG tablet TAKE ONE (1) TABLET EACH DAY  . rosuvastatin (CRESTOR) 10 MG tablet TAKE ONE (1) TABLET EACH DAY    1. Essential hypertension  Patient denies chest pain , sob or headaches. Does not check blood pressure at home. BP Readings from Last 3 Encounters:  07/29/17 138/60  04/11/17 (!) 156/70  02/19/17 (!) 149/61     2. Simple chronic bronchitis ([place])  She uses her advair daily and says she has ben donig well lately. No cough or sob.  3. Osteopenia, unspecified location  Last dexa was in 2015. She is 84 years old and does ot want to do any testing that is not required. She says she is getting around well with occasional back pain.  4. Postmenopausal atrophic vaginitis  Not having in problems  5. Hyperlipidemia with target LDL less than 100  Does not really watch diet all that closely.    New complaints: None today  Social history:  Still lives alone. Has life alert in case of emergency. Her daughter checks on her frequently  Review of Systems  Constitutional: Negative for activity change and appetite change.  HENT: Negative.   Eyes: Negative for pain.  Respiratory: Negative for shortness of breath.   Cardiovascular: Negative  for chest pain, palpitations and leg swelling.  Gastrointestinal: Negative for abdominal pain.  Endocrine: Negative for polydipsia.  Genitourinary: Negative.   Skin: Negative for rash.  Neurological: Negative for dizziness, weakness and headaches.  Hematological: Does not bruise/bleed easily.  Psychiatric/Behavioral: Negative.   All other systems reviewed and are negative.      Objective:   Physical Exam  Constitutional: She is oriented to person, place, and time. She appears well-developed and well-nourished.  HENT:  Nose: Nose normal.  Mouth/Throat: Oropharynx is clear and moist.  Eyes: EOM are normal.  Neck: Trachea normal, normal range of motion and full passive range of motion without pain. Neck supple. No JVD present. Carotid bruit is not present. No thyromegaly present.  Cardiovascular: Normal rate, regular rhythm, normal heart sounds and intact distal pulses. Exam reveals no gallop and no friction rub.  No murmur heard. Pulmonary/Chest: Effort normal and breath sounds normal.  Abdominal: Soft. Bowel sounds are normal. She exhibits no distension and no mass. There is no tenderness.  Musculoskeletal: Normal range of motion.  Lymphadenopathy:    She has no cervical adenopathy.  Neurological: She is alert and oriented to person, place, and time. She has normal reflexes.  Skin: Skin is warm and dry.  Psychiatric: She has a normal mood and affect. Her behavior is normal. Judgment and thought content normal.   BP 125/81   Pulse (!) 133   Temp 97.7 F (36.5 C) (Oral)  Ht 5' (1.524 m)   Wt 107 lb 12.8 oz (48.9 kg)   BMI 21.05 kg/m      Assessment & Plan:  1. Essential hypertension Low sodium diet - lisinopril-hydrochlorothiazide (PRINZIDE,ZESTORETIC) 10-12.5 MG tablet; TAKE ONE (1) TABLET EACH DAY  Dispense: 90 tablet; Refill: 1 - CMP14+EGFR  2. Simple chronic bronchitis (HCC) - Fluticasone-Salmeterol (ADVAIR DISKUS) 100-50 MCG/DOSE AEPB; USE 1 INHALATION TWICE DAILY   Dispense: 60 each; Refill: 5  3. Osteopenia, unspecified location Weight bearing exercise  4. Postmenopausal atrophic vaginitis  5. Hyperlipidemia with target LDL less than 100 Low fat diet - rosuvastatin (CRESTOR) 10 MG tablet; TAKE ONE (1) TABLET EACH DAY  Dispense: 90 tablet; Refill: 1 - Lipid panel    Labs pending Health maintenance reviewed Diet and exercise encouraged Continue all meds Follow up  In 3 months   [place], FNP

## 2017-10-29 LAB — CMP14+EGFR
ALT: 24 IU/L (ref 0–32)
AST: 37 IU/L (ref 0–40)
Albumin/Globulin Ratio: 1.6 (ref 1.2–2.2)
Albumin: 4 g/dL (ref 3.2–4.6)
Alkaline Phosphatase: 47 IU/L (ref 39–117)
BUN/Creatinine Ratio: 21 (ref 12–28)
BUN: 21 mg/dL (ref 10–36)
Bilirubin Total: 0.3 mg/dL (ref 0.0–1.2)
CO2: 25 mmol/L (ref 20–29)
Calcium: 9.5 mg/dL (ref 8.7–10.3)
Chloride: 99 mmol/L (ref 96–106)
Creatinine, Ser: 0.99 mg/dL (ref 0.57–1.00)
GFR calc Af Amer: 58 mL/min/1.73 — ABNORMAL LOW (ref >59)
GFR calc non Af Amer: 50 mL/min/1.73 — ABNORMAL LOW (ref >59)
Globulin, Total: 2.5 g/dL (ref 1.5–4.5)
Glucose: 84 mg/dL (ref 65–99)
Potassium: 4.3 mmol/L (ref 3.5–5.2)
Sodium: 141 mmol/L (ref 134–144)
Total Protein: 6.5 g/dL (ref 6.0–8.5)

## 2017-10-29 LAB — LIPID PANEL
CHOL/HDL RATIO: 2.7 ratio (ref 0.0–4.4)
Cholesterol, Total: 142 mg/dL (ref 100–199)
HDL: 52 mg/dL (ref >39)
LDL CALC: 72 mg/dL (ref 0–99)
Triglycerides: 90 mg/dL (ref 0–149)
VLDL Cholesterol Cal: 18 mg/dL (ref 5–40)

## 2017-12-29 DIAGNOSIS — C44729 Squamous cell carcinoma of skin of left lower limb, including hip: Secondary | ICD-10-CM | POA: Diagnosis not present

## 2018-01-01 ENCOUNTER — Encounter: Payer: Self-pay | Admitting: Family

## 2018-01-01 ENCOUNTER — Ambulatory Visit (INDEPENDENT_AMBULATORY_CARE_PROVIDER_SITE_OTHER): Payer: MEDICARE | Admitting: Family

## 2018-01-01 VITALS — BP 109/71 | HR 91 | Temp 97.7°F | Ht 60.0 in | Wt 102.6 lb

## 2018-01-01 DIAGNOSIS — J410 Simple chronic bronchitis: Secondary | ICD-10-CM

## 2018-01-01 DIAGNOSIS — J441 Chronic obstructive pulmonary disease with (acute) exacerbation: Secondary | ICD-10-CM

## 2018-01-01 MED ORDER — PREDNISONE 10 MG (21) PO TBPK: ORAL_TABLET | ORAL | 0 refills | Status: DC

## 2018-01-01 MED ORDER — AZITHROMYCIN 250 MG PO TABS: ORAL_TABLET | ORAL | 0 refills | Status: DC

## 2018-01-01 NOTE — Progress Notes (Signed)
   Subjective:    Patient ID: Morgan Spears, female    DOB: 1924-08-29, 84 y.o.   MRN: [id number]  Chief Complaint  Patient presents with  . Cough    chest congestion    Cough  This is a new problem. The current episode started in the past 7 days. The problem has been unchanged. The problem occurs every few minutes. The cough is non-productive. Associated symptoms include postnasal drip, rhinorrhea and wheezing. Pertinent negatives include no chills, ear congestion, ear pain, fever, headaches, myalgias, nasal congestion or sore throat. She has tried nothing for the symptoms. The treatment provided mild relief. Her past medical history is significant for COPD. There is no history of asthma.      Review of Systems  Constitutional: Negative for chills and fever.  HENT: Positive for postnasal drip and rhinorrhea. Negative for ear pain and sore throat.   Respiratory: Positive for cough and wheezing.   Musculoskeletal: Negative for myalgias.  Neurological: Negative for headaches.  All other systems reviewed and are negative.      Objective:   Physical Exam  Constitutional: She is oriented to person, place, and time. She appears well-developed and well-nourished. No distress.  HENT:  Head: Normocephalic and atraumatic.  Right Ear: External ear normal.  Left Ear: External ear normal.  Nose: Mucosal edema and rhinorrhea present.  Mouth/Throat: Oropharynx is clear and moist.  Eyes: Pupils are equal, round, and reactive to light.  Neck: Normal range of motion. Neck supple. No thyromegaly present.  Cardiovascular: Normal rate, regular rhythm, normal heart sounds and intact distal pulses.  No murmur heard. Pulmonary/Chest: Effort normal and breath sounds normal. No respiratory distress. She has no wheezes.  Intermittent coarse nonproductive cough  Abdominal: Soft. Bowel sounds are normal. She exhibits no distension. There is no tenderness.  Musculoskeletal: Normal range of motion. She  exhibits no edema or tenderness.  Neurological: She is alert and oriented to person, place, and time. She has normal reflexes. No cranial nerve deficit.  Skin: Skin is warm and dry.  Psychiatric: She has a normal mood and affect. Her behavior is normal. Judgment and thought content normal.  Vitals reviewed.     BP 109/71   Pulse 91   Temp 97.7 F (36.5 C) (Oral)   Ht 5' (1.524 m)   Wt 102 lb 9.6 oz (46.5 kg)   BMI 20.04 kg/m      Assessment & Plan:  Morgan Spears was seen today for cough.  Diagnoses and all orders for this visit:  Simple chronic bronchitis ([place]) -     azithromycin (ZITHROMAX Z-PAK) 250 MG tablet; As directed -     predniSONE (STERAPRED UNI-PAK 21 TAB) 10 MG (21) TBPK tablet; Use as directed  COPD exacerbation (HCC) -     azithromycin (ZITHROMAX Z-PAK) 250 MG tablet; As directed -     predniSONE (STERAPRED UNI-PAK 21 TAB) 10 MG (21) TBPK tablet; Use as directed    - Take meds as prescribed - Use a cool mist humidifier  -Use saline nose sprays frequently -Force fluids -For any cough or congestion  Use plain Mucinex- regular strength or max strength is fine -For fever or aces or pains- take tylenol or ibuprofen. -Throat lozenges if help -RTO if symptoms worsen or do not improve   Morgan Dun, FNP

## 2018-01-01 NOTE — Patient Instructions (Signed)

## 2018-01-22 DIAGNOSIS — C44729 Squamous cell carcinoma of skin of left lower limb, including hip: Secondary | ICD-10-CM | POA: Diagnosis not present

## 2018-02-09 ENCOUNTER — Ambulatory Visit (INDEPENDENT_AMBULATORY_CARE_PROVIDER_SITE_OTHER): Payer: MEDICARE | Admitting: Nurse Practitioner

## 2018-02-09 ENCOUNTER — Encounter: Payer: Self-pay | Admitting: Nurse Practitioner

## 2018-02-09 VITALS — BP 136/89 | HR 108 | Temp 96.8°F | Ht 60.0 in | Wt 100.0 lb

## 2018-02-09 DIAGNOSIS — E785 Hyperlipidemia, unspecified: Secondary | ICD-10-CM | POA: Diagnosis not present

## 2018-02-09 DIAGNOSIS — J410 Simple chronic bronchitis: Secondary | ICD-10-CM | POA: Diagnosis not present

## 2018-02-09 DIAGNOSIS — M8580 Other specified disorders of bone density and structure, unspecified site: Secondary | ICD-10-CM

## 2018-02-09 DIAGNOSIS — I10 Essential (primary) hypertension: Secondary | ICD-10-CM

## 2018-02-09 DIAGNOSIS — I4821 Permanent atrial fibrillation: Secondary | ICD-10-CM

## 2018-02-09 DIAGNOSIS — I482 Chronic atrial fibrillation: Secondary | ICD-10-CM | POA: Diagnosis not present

## 2018-02-09 NOTE — Addendum Note (Signed)
Addended by: Rolena Infante on: 02/09/2018 10:19 AM   Modules accepted: Orders

## 2018-02-09 NOTE — Patient Instructions (Signed)

## 2018-02-09 NOTE — Progress Notes (Signed)
Subjective:    Patient ID: Morgan Spears, female    DOB: Sep 21, 1924, 84 y.o.   MRN: [id number]   Chief Complaint: Medical Management of Chronic Issues   HPI:  1. Essential hypertension  No c/o chest pain, sob or headache.does not check blood pressures at home. BP Readings from Last 3 Encounters:  01/01/18 109/71  10/28/17 125/81  07/29/17 138/60     2. Osteopenia, unspecified location  Last dexascan was 05/04/14 with tscore of -1.4. She denies nay back pain.  3. Hyperlipidemia with target LDL less than 100  Does not eat a lot of fried foods.  4. Simple chronic bronchitis ([place])  Had flare up last month and was treated wit z pak and steroids. Did well.    Outpatient Encounter Medications as of 02/09/2018  Medication Sig  . aspirin 81 MG EC tablet Take 81 mg by mouth daily.    Marland Kitchen azithromycin (ZITHROMAX Z-PAK) 250 MG tablet As directed  . Calcium Carbonate-Vitamin D (CALCIUM 600 + D PO) Take 1 capsule by mouth daily.   . Cholecalciferol (VITAMIN D3) 1000 UNITS CAPS Take 1 capsule by mouth daily.    . Fluticasone-Salmeterol (ADVAIR DISKUS) 100-50 MCG/DOSE AEPB USE 1 INHALATION TWICE DAILY  . geriatric multivitamins-minerals (ELDERTONIC/GEVRABON) ELIX Take 15 mLs by mouth daily.    Marland Kitchen lisinopril-hydrochlorothiazide (PRINZIDE,ZESTORETIC) 10-12.5 MG tablet TAKE ONE (1) TABLET EACH DAY  . predniSONE (STERAPRED UNI-PAK 21 TAB) 10 MG (21) TBPK tablet Use as directed  . rosuvastatin (CRESTOR) 10 MG tablet TAKE ONE (1) TABLET EACH DAY      New complaints: None today  Social history: Lives by herself- has medical alert bracelet that she wears all the time.   Review of Systems  Constitutional: Negative for activity change and appetite change.  HENT: Negative.   Eyes: Negative for pain.  Respiratory: Negative for shortness of breath.   Cardiovascular: Negative for chest pain, palpitations and leg swelling.  Gastrointestinal: Negative for abdominal pain.  Endocrine: Negative  for polydipsia.  Genitourinary: Negative.   Skin: Negative for rash.  Neurological: Negative for dizziness, weakness and headaches.  Hematological: Does not bruise/bleed easily.  Psychiatric/Behavioral: Negative.   All other systems reviewed and are negative.      Objective:   Physical Exam  Constitutional: She is oriented to person, place, and time.  HENT:  Head: Normocephalic.  Nose: Nose normal.  Mouth/Throat: Oropharynx is clear and moist.  Eyes: Pupils are equal, round, and reactive to light. EOM are normal.  Neck: Normal range of motion. Neck supple. No JVD present. Carotid bruit is not present.  Cardiovascular: Normal rate, normal heart sounds and intact distal pulses.  Pulmonary/Chest: Effort normal and breath sounds normal. No respiratory distress. She has no wheezes. She has no rales. She exhibits no tenderness.  Abdominal: Soft. Normal appearance, normal aorta and bowel sounds are normal. She exhibits no distension, no abdominal bruit, no pulsatile midline mass and no mass. There is no splenomegaly or hepatomegaly. There is no tenderness.  Musculoskeletal: Normal range of motion. She exhibits no edema.  Lymphadenopathy:    She has no cervical adenopathy.  Neurological: She is alert and oriented to person, place, and time. She has normal reflexes.  Skin: Skin is warm and dry.  Psychiatric: She has a normal mood and affect. Her behavior is normal. Judgment and thought content normal.   BP 136/89   Pulse (!) 108   Temp (!) 96.8 F (36 C) (Oral)   Ht 5' (1.524 m)  Wt 100 lb (45.4 kg)   BMI 19.53 kg/m   EKG- atrial fib  CHF- 0 HTN- 0 Age>70- 1 Diabetes- 0 Hx stroke (2)- 0 Vascular disease- 0 Age >65-1 Sex- female-1 Total     3      Assessment & Plan:  Morgan Spears comes in today with chief complaint of Medical Management of Chronic Issues (Right hip sore)   Diagnosis and orders addressed:  1. Essential hypertension Low sodium diet - EKG  12-Lead  2. Osteopenia, unspecified location Weight bearing exercises Fall preventtion  3. Hyperlipidemia with target LDL less than 100 Low fat diet  4. Simple chronic bronchitis ([place])  5. Permanent atrial fibrillation ([place]) New onset [place] score does not warrant coag treatment other then daily ASA   Labs pending Health Maintenance reviewed Diet and exercise encouraged  Follow up plan: 3 months   [place], FNP

## 2018-02-10 LAB — CMP14+EGFR
ALBUMIN: 4.1 g/dL (ref 3.2–4.6)
ALK PHOS: 45 IU/L (ref 39–117)
ALT: 27 IU/L (ref 0–32)
AST: 44 IU/L — ABNORMAL HIGH (ref 0–40)
Albumin/Globulin Ratio: 2 (ref 1.2–2.2)
BILIRUBIN TOTAL: 0.5 mg/dL (ref 0.0–1.2)
BUN / CREAT RATIO: 16 (ref 12–28)
BUN: 14 mg/dL (ref 10–36)
CHLORIDE: 98 mmol/L (ref 96–106)
CO2: 27 mmol/L (ref 20–29)
CREATININE: 0.85 mg/dL (ref 0.57–1.00)
Calcium: 9.4 mg/dL (ref 8.7–10.3)
GFR calc non Af Amer: 61 mL/min/{1.73_m2} (ref >59)
GFR, EST AFRICAN AMERICAN: 70 mL/min/{1.73_m2} (ref >59)
GLOBULIN, TOTAL: 2.1 g/dL (ref 1.5–4.5)
Glucose: 75 mg/dL (ref 65–99)
Potassium: 4.1 mmol/L (ref 3.5–5.2)
SODIUM: 141 mmol/L (ref 134–144)
Total Protein: 6.2 g/dL (ref 6.0–8.5)

## 2018-02-10 LAB — LIPID PANEL
Chol/HDL Ratio: 2.9 ratio (ref 0.0–4.4)
Cholesterol, Total: 151 mg/dL (ref 100–199)
HDL: 52 mg/dL (ref >39)
LDL CALC: 83 mg/dL (ref 0–99)
TRIGLYCERIDES: 81 mg/dL (ref 0–149)
VLDL Cholesterol Cal: 16 mg/dL (ref 5–40)

## 2018-02-16 ENCOUNTER — Ambulatory Visit (INDEPENDENT_AMBULATORY_CARE_PROVIDER_SITE_OTHER): Payer: MEDICARE | Admitting: Nurse Practitioner

## 2018-02-16 ENCOUNTER — Encounter: Payer: Self-pay | Admitting: Nurse Practitioner

## 2018-02-16 VITALS — BP 146/68 | HR 116 | Temp 97.7°F | Ht 60.0 in | Wt 98.0 lb

## 2018-02-16 DIAGNOSIS — M546 Pain in thoracic spine: Secondary | ICD-10-CM

## 2018-02-16 DIAGNOSIS — N3000 Acute cystitis without hematuria: Secondary | ICD-10-CM

## 2018-02-16 LAB — URINALYSIS, COMPLETE
Bilirubin, UA: NEGATIVE
GLUCOSE, UA: NEGATIVE
Ketones, UA: NEGATIVE
NITRITE UA: POSITIVE — AB
Specific Gravity, UA: 1.02 (ref 1.005–1.030)
UUROB: 0.2 mg/dL (ref 0.2–1.0)
pH, UA: 6 (ref 5.0–7.5)

## 2018-02-16 LAB — MICROSCOPIC EXAMINATION: Renal Epithel, UA: NONE SEEN /hpf

## 2018-02-16 MED ORDER — CIPROFLOXACIN HCL 500 MG PO TABS: 500.0000 mg | ORAL_TABLET | Freq: Two times a day (BID) | ORAL | 0 refills | Status: DC

## 2018-02-16 NOTE — Progress Notes (Signed)
   Subjective:    Patient ID: Morgan Spears, female    DOB: 1925-05-27, 84 y.o.   MRN: [id number]   Chief Complaint: Back Pain   HPI Patient is brought in by her daughter with c/o back pain. She went to get out if chair Saturday and could hardly straighten up due to back pain. It is still hurting some but is not as bad. Gait is really slow. She denies any injury. But her daughter says that she sits in recliner and that she cannot work handle to let it down when she wants and she climbs over the foot of it to get in and out. Rates pain 8/10 right now. Nothing seems to make it worse. Motrin has helped some.  Review of Systems  Constitutional: Negative.   Respiratory: Negative.   Cardiovascular: Negative.   Musculoskeletal: Positive for back pain and gait problem.  Psychiatric/Behavioral: Negative.   All other systems reviewed and are negative.      Objective:   Physical Exam  Constitutional: She appears well-developed and well-nourished. No distress.  Cardiovascular: Normal rate and regular rhythm.  Pulmonary/Chest: Effort normal.  Genitourinary:  Genitourinary Comments: left CVA tenderness  Musculoskeletal: She exhibits no edema.  Mid back pain with limited rom on flexion and extension.  Rises slowly from sitting to standing (+) slr on left at 90 degrees  Neurological: She is alert. She displays normal reflexes.  Skin: Skin is warm.   BP (!) 146/68   Pulse (!) 116   Temp 97.7 F (36.5 C) (Oral)   Ht 5' (1.524 m)   Wt 98 lb (44.5 kg)   BMI 19.14 kg/m   Urine >30wbc (+) nitrites       Assessment & Plan:  Morgan Spears in today with chief complaint of Back Pain   1. Acute midline thoracic back pain Motrin as needed for pain Heating pad if helps - Urinalysis, Complete  2. Acute cystitis without hematuria Take medication as prescribe Cotton underwear Take shower not bath Cranberry juice, yogurt Force fluids AZO over the counter X2 days Culture  pending RTO prn Meds ordered this encounter  Medications  . ciprofloxacin (CIPRO) 500 MG tablet    Sig: Take 1 tablet (500 mg total) by mouth 2 (two) times daily.    Dispense:  20 tablet    Refill:  0    Order Specific Question:   Supervising Provider    Answer:   [place] Urine Culture

## 2018-02-16 NOTE — Patient Instructions (Signed)
Take medication as prescribe Cotton underwear Take shower not bath Cranberry juice, yogurt Force fluids AZO over the counter X2 days Culture pending RTO prn  

## 2018-02-18 LAB — URINE CULTURE

## 2018-05-12 ENCOUNTER — Ambulatory Visit (INDEPENDENT_AMBULATORY_CARE_PROVIDER_SITE_OTHER): Payer: MEDICARE | Admitting: Nurse Practitioner

## 2018-05-12 ENCOUNTER — Encounter: Payer: Self-pay | Admitting: Nurse Practitioner

## 2018-05-12 VITALS — BP 156/88 | HR 115 | Temp 97.4°F | Ht 60.0 in | Wt 95.0 lb

## 2018-05-12 DIAGNOSIS — E785 Hyperlipidemia, unspecified: Secondary | ICD-10-CM | POA: Diagnosis not present

## 2018-05-12 DIAGNOSIS — I10 Essential (primary) hypertension: Secondary | ICD-10-CM

## 2018-05-12 DIAGNOSIS — M8580 Other specified disorders of bone density and structure, unspecified site: Secondary | ICD-10-CM

## 2018-05-12 LAB — LIPID PANEL
CHOLESTEROL TOTAL: 145 mg/dL (ref 100–199)
Chol/HDL Ratio: 2.7 ratio (ref 0.0–4.4)
HDL: 54 mg/dL (ref >39)
LDL Calculated: 77 mg/dL (ref 0–99)
Triglycerides: 69 mg/dL (ref 0–149)
VLDL CHOLESTEROL CAL: 14 mg/dL (ref 5–40)

## 2018-05-12 LAB — CMP14+EGFR
A/G RATIO: 1.9 (ref 1.2–2.2)
ALBUMIN: 4.4 g/dL (ref 3.2–4.6)
ALK PHOS: 53 IU/L (ref 39–117)
ALT: 24 IU/L (ref 0–32)
AST: 43 IU/L — ABNORMAL HIGH (ref 0–40)
BUN/Creatinine Ratio: 18 (ref 12–28)
BUN: 16 mg/dL (ref 10–36)
Bilirubin Total: 0.6 mg/dL (ref 0.0–1.2)
CHLORIDE: 98 mmol/L (ref 96–106)
CO2: 26 mmol/L (ref 20–29)
Calcium: 10.2 mg/dL (ref 8.7–10.3)
Creatinine, Ser: 0.89 mg/dL (ref 0.57–1.00)
GFR calc non Af Amer: 57 mL/min/{1.73_m2} — ABNORMAL LOW (ref >59)
GFR, EST AFRICAN AMERICAN: 66 mL/min/{1.73_m2} (ref >59)
GLOBULIN, TOTAL: 2.3 g/dL (ref 1.5–4.5)
GLUCOSE: 81 mg/dL (ref 65–99)
POTASSIUM: 4.6 mmol/L (ref 3.5–5.2)
SODIUM: 139 mmol/L (ref 134–144)
TOTAL PROTEIN: 6.7 g/dL (ref 6.0–8.5)

## 2018-05-12 NOTE — Addendum Note (Signed)
Addended by: Chevis Pretty on: 05/12/2018 09:08 AM   Modules accepted: Orders

## 2018-05-12 NOTE — Patient Instructions (Signed)

## 2018-05-12 NOTE — Progress Notes (Signed)
   Subjective:    Patient ID: Morgan Spears, female    DOB: [birth year], 84 y.o.   MRN: [id number]   Chief Complaint: Medical Management of Chronic Issues   HPI:  1. Essential hypertension  - -   2. Osteopenia, unspecified location  - -   3. Hyperlipidemia with target LDL less than 100  - -     Outpatient Encounter Medications as of 05/12/2018  Medication Sig  . aspirin 81 MG EC tablet Take 81 mg by mouth daily.    . Calcium Carbonate-Vitamin D (CALCIUM 600 + D PO) Take 1 capsule by mouth daily.   . Cholecalciferol (VITAMIN D3) 1000 UNITS CAPS Take 1 capsule by mouth daily.    . Fluticasone-Salmeterol (ADVAIR DISKUS) 100-50 MCG/DOSE AEPB USE 1 INHALATION TWICE DAILY  . geriatric multivitamins-minerals (ELDERTONIC/GEVRABON) ELIX Take 15 mLs by mouth daily.    Marland Kitchen lisinopril-hydrochlorothiazide (PRINZIDE,ZESTORETIC) 10-12.5 MG tablet TAKE ONE (1) TABLET EACH DAY  . rosuvastatin (CRESTOR) 10 MG tablet TAKE ONE (1) TABLET EACH DAY  . [DISCONTINUED] ciprofloxacin (CIPRO) 500 MG tablet Take 1 tablet (500 mg total) by mouth 2 (two) times daily.   No facility-administered encounter medications on file as of 05/12/2018.     New complaints:   Social history:    Review of Systems     Objective:   Physical Exam  BP (!) 156/88   Pulse (!) 115   Temp (!) 97.4 F (36.3 C) (Oral)   Ht 5' (1.524 m)   Wt 95 lb (43.1 kg)   BMI 18.55 kg/m      Assessment & Plan:  Morgan Spears comes in today with chief complaint of Medical Management of Chronic Issues   Diagnosis and orders addressed:  1. Essential hypertension -continue lisinopril/hctz -CMET  2. Osteopenia, unspecified location -continue Vit D & Vit D3 -weight bearing exercises  3. Hyperlipidemia with target LDL less than 100 -continue Crestor -lipid panel -avoid fatty foods   Labs pending Health Maintenance reviewed Diet and exercise encouraged  Follow up plan: 6 months   [place],  FNP

## 2018-05-12 NOTE — Progress Notes (Signed)
Subjective:    Patient ID: Morgan Spears, female    DOB: 1925-04-01, 84 y.o.   MRN: [id number]   Chief Complaint: Medical Management of CHronic issues  HPI:  1. Essential hypertension  No c/o chest pain, sob or headache. Does not check blood pressure at home. BP Readings from Last 3 Encounters:  02/16/18 (!) 146/68  02/09/18 136/89  01/01/18 109/71     2. Osteopenia, unspecified location  Last dexascan was 05/04/14. Patient does not want to do anymore dexascans due to age.  3. Hyperlipidemia with target LDL less than 100  Avoids a lot of fried foods.  4.     Atrial fib         Doing baby asa daily  Outpatient Encounter Medications as of 05/12/2018  Medication Sig  . aspirin 81 MG EC tablet Take 81 mg by mouth daily.    . Calcium Carbonate-Vitamin D (CALCIUM 600 + D PO) Take 1 capsule by mouth daily.   . Cholecalciferol (VITAMIN D3) 1000 UNITS CAPS Take 1 capsule by mouth daily.    . ciprofloxacin (CIPRO) 500 MG tablet Take 1 tablet (500 mg total) by mouth 2 (two) times daily.  . Fluticasone-Salmeterol (ADVAIR DISKUS) 100-50 MCG/DOSE AEPB USE 1 INHALATION TWICE DAILY  . geriatric multivitamins-minerals (ELDERTONIC/GEVRABON) ELIX Take 15 mLs by mouth daily.    Marland Kitchen lisinopril-hydrochlorothiazide (PRINZIDE,ZESTORETIC) 10-12.5 MG tablet TAKE ONE (1) TABLET EACH DAY  . rosuvastatin (CRESTOR) 10 MG tablet TAKE ONE (1) TABLET EACH DAY       New complaints: None today  Social history: Still lives by herself. Her daughter checks on her daily. Patient refuses medical alert system.   Review of Systems  Constitutional: Negative for activity change and appetite change.  HENT: Negative.   Eyes: Negative for pain.  Respiratory: Negative for shortness of breath.   Cardiovascular: Negative for chest pain, palpitations and leg swelling.  Gastrointestinal: Negative for abdominal pain.  Endocrine: Negative for polydipsia.  Genitourinary: Negative.   Skin: Negative for rash.    Neurological: Negative for dizziness, weakness and headaches.  Hematological: Does not bruise/bleed easily.  Psychiatric/Behavioral: Negative.   All other systems reviewed and are negative.      Objective:   Physical Exam  Constitutional: She is oriented to person, place, and time. She appears well-developed and well-nourished. No distress.  HENT:  Head: Normocephalic.  Nose: Nose normal.  Mouth/Throat: Oropharynx is clear and moist.  Eyes: Pupils are equal, round, and reactive to light. EOM are normal.  Neck: Normal range of motion. Neck supple. No JVD present. Carotid bruit is not present.  Cardiovascular: Normal rate, normal heart sounds and intact distal pulses.  irregular  Pulmonary/Chest: Effort normal and breath sounds normal. No respiratory distress. She has no wheezes. She has no rales. She exhibits no tenderness.  Abdominal: Soft. Normal appearance, normal aorta and bowel sounds are normal. She exhibits no distension, no abdominal bruit, no pulsatile midline mass and no mass. There is no splenomegaly or hepatomegaly. There is no tenderness.  Musculoskeletal: Normal range of motion. She exhibits no edema.  Lymphadenopathy:    She has no cervical adenopathy.  Neurological: She is alert and oriented to person, place, and time. She has normal reflexes.  Skin: Skin is warm and dry.  Psychiatric: She has a normal mood and affect. Her behavior is normal. Judgment and thought content normal.  Nursing note and vitals reviewed.  BP (!) 156/88   Pulse (!) 115   Temp (!) 97.4 F (  36.3 C) (Oral)   Ht 5' (1.524 m)   Wt 95 lb (43.1 kg)   BMI 18.55 kg/m         Assessment & Plan:  Morgan Spears comes in today with chief complaint of Medical Management of Chronic Issues   Diagnosis and orders addressed:  1. Essential hypertension Low sodium diet  2. Osteopenia, unspecified location Weight bearing exercises  3. Hyperlipidemia with target LDL less than 100 Low fat  diet   Labs pending Health Maintenance reviewed Diet and exercise encouraged  Follow up plan: 3 months   Mary-Margaret Hassell Done, FNP

## 2018-07-10 ENCOUNTER — Other Ambulatory Visit: Payer: Self-pay | Admitting: Nurse Practitioner

## 2018-07-10 DIAGNOSIS — I10 Essential (primary) hypertension: Secondary | ICD-10-CM

## 2018-07-14 ENCOUNTER — Telehealth: Payer: Self-pay | Admitting: Nurse Practitioner

## 2018-08-13 ENCOUNTER — Ambulatory Visit (INDEPENDENT_AMBULATORY_CARE_PROVIDER_SITE_OTHER): Payer: MEDICARE | Admitting: *Deleted

## 2018-08-13 ENCOUNTER — Ambulatory Visit (INDEPENDENT_AMBULATORY_CARE_PROVIDER_SITE_OTHER): Payer: MEDICARE | Admitting: Nurse Practitioner

## 2018-08-13 ENCOUNTER — Encounter: Payer: Self-pay | Admitting: Nurse Practitioner

## 2018-08-13 VITALS — BP 142/84 | HR 81 | Temp 97.9°F | Ht 60.0 in | Wt 98.6 lb

## 2018-08-13 VITALS — BP 152/83 | HR 81 | Temp 97.9°F | Ht 60.0 in | Wt 98.6 lb

## 2018-08-13 DIAGNOSIS — J410 Simple chronic bronchitis: Secondary | ICD-10-CM | POA: Diagnosis not present

## 2018-08-13 DIAGNOSIS — Z0000 Encounter for general adult medical examination without abnormal findings: Secondary | ICD-10-CM

## 2018-08-13 DIAGNOSIS — M8580 Other specified disorders of bone density and structure, unspecified site: Secondary | ICD-10-CM

## 2018-08-13 DIAGNOSIS — I4821 Permanent atrial fibrillation: Secondary | ICD-10-CM | POA: Diagnosis not present

## 2018-08-13 DIAGNOSIS — E785 Hyperlipidemia, unspecified: Secondary | ICD-10-CM | POA: Diagnosis not present

## 2018-08-13 DIAGNOSIS — I10 Essential (primary) hypertension: Secondary | ICD-10-CM | POA: Diagnosis not present

## 2018-08-13 DIAGNOSIS — Z23 Encounter for immunization: Secondary | ICD-10-CM | POA: Diagnosis not present

## 2018-08-13 MED ORDER — ROSUVASTATIN CALCIUM 10 MG PO TABS: ORAL_TABLET | ORAL | 1 refills | Status: DC

## 2018-08-13 NOTE — Progress Notes (Signed)
Subjective:    Patient ID: Morgan Spears, female    DOB: [birth year], 84 y.o.   MRN: [id number]   Chief Complaint: Medical Management of Chronic Issues    HPI:  1. Essential hypertension  No c/o chest pain, SOB or headache. Does not check blood pressure at home.  BP Readings from Last 3 Encounters:  08/13/18 (!) 152/83  08/13/18 (!) 152/83  05/12/18 (!) 156/88     2. Simple chronic bronchitis ([place])  Has had no cough . Has not needed inhaler.  3. Osteopenia, unspecified location  We are no longer doing dexascan due to age.  4. Hyperlipidemia with target LDL less than 100  Has poor appetite and eats whatever she wants.  5.      Atrial fib          Was diagnosed earlier this year. Is on a daily baby aspirin.  Outpatient Encounter Medications as of 08/13/2018  Medication Sig  . aspirin 81 MG EC tablet Take 81 mg by mouth daily.    . Calcium Carbonate-Vitamin D (CALCIUM 600 + D PO) Take 1 capsule by mouth daily.   . Cholecalciferol (VITAMIN D3) 1000 UNITS CAPS Take 1 capsule by mouth daily.    . Fluticasone-Salmeterol (ADVAIR DISKUS) 100-50 MCG/DOSE AEPB USE 1 INHALATION TWICE DAILY  . geriatric multivitamins-minerals (ELDERTONIC/GEVRABON) ELIX Take 15 mLs by mouth daily.    Marland Kitchen lisinopril-hydrochlorothiazide (PRINZIDE,ZESTORETIC) 10-12.5 MG tablet TAKE ONE (1) TABLET EACH DAY  . rosuvastatin (CRESTOR) 10 MG tablet TAKE ONE (1) TABLET EACH DAY       New complaints: None today  Social history: lives alone but daughter checks  On her everyday   Review of Systems  Constitutional: Negative for activity change and appetite change.  HENT: Negative.   Eyes: Negative for pain.  Respiratory: Negative for shortness of breath.   Cardiovascular: Negative for chest pain, palpitations and leg swelling.  Gastrointestinal: Negative for abdominal pain.  Endocrine: Negative for polydipsia.  Genitourinary: Negative.   Skin: Negative for rash.  Neurological: Negative for  dizziness, weakness and headaches.  Hematological: Does not bruise/bleed easily.  Psychiatric/Behavioral: Negative.   All other systems reviewed and are negative.      Objective:   Physical Exam Vitals signs and nursing note reviewed.  Constitutional:      General: She is not in acute distress.    Appearance: Normal appearance. She is well-developed.  HENT:     Head: Normocephalic.     Nose: Nose normal.  Eyes:     Pupils: Pupils are equal, round, and reactive to light.  Neck:     Musculoskeletal: Normal range of motion and neck supple.     Vascular: No carotid bruit or JVD.  Cardiovascular:     Rate and Rhythm: Normal rate and regular rhythm.     Heart sounds: Normal heart sounds.  Pulmonary:     Effort: Pulmonary effort is normal. No respiratory distress.     Breath sounds: Normal breath sounds. No wheezing or rales.  Chest:     Chest wall: No tenderness.  Abdominal:     General: Bowel sounds are normal. There is no distension or abdominal bruit.     Palpations: Abdomen is soft. There is no hepatomegaly, splenomegaly, mass or pulsatile mass.     Tenderness: There is no abdominal tenderness.  Musculoskeletal: Normal range of motion.  Lymphadenopathy:     Cervical: No cervical adenopathy.  Skin:    General: Skin is warm and dry.  Neurological:     Mental Status: She is alert and oriented to person, place, and time.     Deep Tendon Reflexes: Reflexes are normal and symmetric.  Psychiatric:        Behavior: Behavior normal.        Thought Content: Thought content normal.        Judgment: Judgment normal.    BP (!) 142/84 (BP Location: Left Arm)   Pulse 81   Temp 97.9 F (36.6 C) (Oral)   Ht 5' (1.524 m)   Wt 98 lb 9.6 oz (44.7 kg)   BMI 19.26 kg/m      Assessment & Plan:  Kelty Szafran comes in today with chief complaint of Medical Management of Chronic Issues (3 month ) and Hypertension   Diagnosis and orders addressed:  1. Essential hypertension Low  sodium diet  2. Simple chronic bronchitis (HCC) Avoid cigarette smoke   3. Osteopenia, unspecified location Weight bearing exercises when can  4. Hyperlipidemia with target LDL less than 100 Low fat diet - rosuvastatin (CRESTOR) 10 MG tablet; TAKE ONE (1) TABLET EACH DAY  Dispense: 90 tablet; Refill: 1  5. Permanent atrial fibrillation Continue daily baby aspirin   Labs pending Health Maintenance reviewed Diet and exercise encouraged  Follow up plan: 6 months   [place], FNP

## 2018-08-13 NOTE — Patient Instructions (Signed)
Atrial Fibrillation  Atrial fibrillation is a type of heartbeat that is irregular or fast (rapid). If you have this condition, your heart beats without any order. This makes it hard for your heart to pump blood in a normal way. Having this condition gives you more risk for stroke, heart failure, and other heart problems. Atrial fibrillation may start all of a sudden and then stop on its own, or it may become a long-lasting problem. What are the causes? This condition may be caused by heart conditions, such as:  High blood pressure.  Heart failure.  Heart valve disease.  Heart surgery. Other causes include:  Pneumonia.  Obstructive sleep apnea.  Lung cancer.  Thyroid disease.  Drinking too much alcohol. Sometimes the cause is not known. What increases the risk? You are more likely to develop this condition if:  You smoke.  You are older.  You have diabetes.  You are overweight.  You have a family history of this condition.  You exercise often and hard. What are the signs or symptoms? Common symptoms of this condition include:  A feeling like your heart is beating very fast.  Chest pain.  Feeling short of breath.  Feeling light-headed or weak.  Getting tired easily. Follow these instructions at home: Medicines  Take over-the-counter and prescription medicines only as told by your doctor.  If your doctor gives you a blood-thinning medicine, take it exactly as told. Taking too much of it can cause bleeding. Taking too little of it does not protect you against clots. Clots can cause a stroke. Lifestyle      Do not use any tobacco products. These include cigarettes, chewing tobacco, and e-cigarettes. If you need help quitting, ask your doctor.  Do not drink alcohol.  Do not drink beverages that have caffeine. These include coffee, soda, and tea.  Follow diet instructions as told by your doctor.  Exercise regularly as told by your doctor. General  instructions  If you have a condition that causes breathing to stop for a short period of time (apnea), treat it as told by your doctor.  Keep a healthy weight. Do not use diet pills unless your doctor says they are safe for you. Diet pills may make heart problems worse.  Keep all follow-up visits as told by your doctor. This is important. Contact a doctor if:  You notice a change in the speed, rhythm, or strength of your heartbeat.  You are taking a blood-thinning medicine and you see more bruising.  You get tired more easily when you move or exercise.  You have a sudden change in weight. Get help right away if:   You have pain in your chest or your belly (abdomen).  You have trouble breathing.  You have blood in your vomit, poop, or pee (urine).  You have any signs of a stroke. "BE FAST" is an easy way to remember the main warning signs: ? B - Balance. Signs are dizziness, sudden trouble walking, or loss of balance. ? E - Eyes. Signs are trouble seeing or a change in how you see. ? F - Face. Signs are sudden weakness or loss of feeling in the face, or the face or eyelid drooping on one side. ? A - Arms. Signs are weakness or loss of feeling in an arm. This happens suddenly and usually on one side of the body. ? S - Speech. Signs are sudden trouble speaking, slurred speech, or trouble understanding what people say. ? T - Time.  or loss of feeling in the face, or the face or eyelid drooping on one side.  ? A - Arms. Signs are weakness or loss of feeling in an arm. This happens suddenly and usually on one side of the body.  ? S - Speech. Signs are sudden trouble speaking, slurred speech, or trouble understanding what people say.  ? T - Time. Time to call emergency services. Write down what time symptoms started.  · You have other signs of a stroke, such as:  ? A sudden, very bad headache with no known cause.  ? Feeling sick to your stomach (nausea).  ? Throwing up (vomiting).  ? Jerky movements you cannot control (seizure).  These symptoms may be an emergency. Do not wait to see if the symptoms will go away. Get medical help right away. Call your local emergency services (911 in the [place] Do not drive yourself to the hospital.  Summary  · Atrial fibrillation is a type of heartbeat that is irregular  or fast (rapid).  · You are at higher risk of this condition if you smoke, are older, have diabetes, or are overweight.  · Follow your doctor's instructions about medicines, diet, exercise, and follow-up visits.  · Get help right away if you think that you have signs of a stroke.  This information is not intended to replace advice given to you by your health care provider. Make sure you discuss any questions you have with your health care provider.  Document Released: 05/21/2008 Document Revised: 10/03/2017 Document Reviewed: 10/03/2017  Elsevier Interactive Patient Education © 2019 [employer] Inc[employer]

## 2018-08-13 NOTE — Patient Instructions (Signed)
  Ms. Fennelly , Thank you for taking time to come for your Medicare Wellness Visit. I appreciate your ongoing commitment to your health goals. Please review the following plan we discussed and let me know if I can assist you in the future.   These are the goals we discussed: Goals    . Exercise 3x per week (30 min per time)     Continue your daily exercise routine and keep up the great work;       This is a list of the screening recommended for you and due dates:  Health Maintenance  Topic Date Due  . Tetanus Vaccine  02/10/2019*  . Flu Shot  Completed  . DEXA scan (bone density measurement)  Completed  . Pneumonia vaccines  Completed  *Topic was postponed. The date shown is not the original due date.

## 2018-08-13 NOTE — Progress Notes (Addendum)
Subjective:   Morgan Spears is a 84 y.o. female who presents for Medicare Annual (Subsequent) preventive examination. Patient was accompanied by her daughter today. Patient retired from a bank. In her free time she enjoys reading and working crossword puzzles. She exercises daily. She states her diet is healthy. Patient attends [employer] and is involved with the [employer] women's group. She lives at home by herself. She had two children one daughter and son. Her son is deceased. She does have stairs going into the basement and upstairs. Patient does use the basement stairs often due to her cats staying in the basement. She has one rug in front of the kitchen sink. Patient has not had any hospitalizations or surgeries in the last year. Patient states her health is the same as it was last year at this time.   Review of Systems:   Cardiac Risk Factors include: advanced age (>86men, >17 women);hypertension     Objective:     Vitals: BP (!) 152/83   Pulse 81   Temp 97.9 F (36.6 C) (Oral)   Ht 5' (1.524 m)   Wt 98 lb 9.6 oz (44.7 kg)   BMI 19.26 kg/m   Body mass index is 19.26 kg/m.  Advanced Directives 08/13/2018  Does Patient Have a Medical Advance Directive? Yes  Type of Paramedic of [place];Living will  Copy of [place] in Chart? No - copy requested    Tobacco Social History   Tobacco Use  Smoking Status Never Smoker  Smokeless Tobacco Never Used     Counseling given: Not Answered   Past Medical History:  Diagnosis Date  . Cataract   . COPD (chronic obstructive pulmonary disease) ([place])   . Diffuse cystic mastopathy   . Frequency of urination and polyuria   . Hypertension   . Osteopenia   . Other and unspecified hyperlipidemia   . Postmenopausal atrophic vaginitis    History reviewed. No pertinent surgical history. Family History  Problem Relation Age of Onset  . Heart disease Mother   . Hypertension  Mother   . Alcohol abuse Father   . Cancer Sister   . Emphysema Sister        smoker  . Heart disease Brother   . Alcohol abuse Brother    Social History   Socioeconomic History  . Marital status: Widowed    Spouse name: Not on file  . Number of children: 2  . Years of education: 12th  . Highest education level: 12th grade  Occupational History  . Not on file  Social Needs  . Financial resource strain: Not hard at all  . Food insecurity:    Worry: Never true    Inability: Never true  . Transportation needs:    Medical: No    Non-medical: No  Tobacco Use  . Smoking status: Never Smoker  . Smokeless tobacco: Never Used  Substance and Sexual Activity  . Alcohol use: No    Comment: occassioal wine - 1-2 servings per week  . Drug use: No  . Sexual activity: Never  Lifestyle  . Physical activity:    Days per week: 7 days    Minutes per session: 20 min  . Stress: Not at all  Relationships  . Social connections:    Talks on phone: More than three times a week    Gets together: Once a week    Attends religious service: More than 4 times per year  Active member of club or organization: Yes    Attends meetings of clubs or organizations: More than 4 times per year    Relationship status: Widowed  Other Topics Concern  . Not on file  Social History Narrative  . Not on file    Outpatient Encounter Medications as of 08/13/2018  Medication Sig  . aspirin 81 MG EC tablet Take 81 mg by mouth daily.    . Calcium Carbonate-Vitamin D (CALCIUM 600 + D PO) Take 1 capsule by mouth daily.   . Cholecalciferol (VITAMIN D3) 1000 UNITS CAPS Take 1 capsule by mouth daily.    . Fluticasone-Salmeterol (ADVAIR DISKUS) 100-50 MCG/DOSE AEPB USE 1 INHALATION TWICE DAILY  . geriatric multivitamins-minerals (ELDERTONIC/GEVRABON) ELIX Take 15 mLs by mouth daily.    Marland Kitchen lisinopril-hydrochlorothiazide (PRINZIDE,ZESTORETIC) 10-12.5 MG tablet TAKE ONE (1) TABLET EACH DAY  . rosuvastatin (CRESTOR)  10 MG tablet TAKE ONE (1) TABLET EACH DAY   No facility-administered encounter medications on file as of 08/13/2018.     Activities of Daily Living In your present state of health, do you have any difficulty performing the following activities: 08/13/2018  Hearing? Y  Vision? N  Comment Wears glasses for close up   Difficulty concentrating or making decisions? N  Walking or climbing stairs? N  Dressing or bathing? N  Doing errands, shopping? N  Preparing Food and eating ? N  Using the Toilet? N  In the past six months, have you accidently leaked urine? N  Do you have problems with loss of bowel control? N  Managing your Medications? N  Managing your Finances? N  Comment Daughter Clinical cytogeneticist or managing your Housekeeping? N  Some recent data might be hidden    Patient Care Team: Chevis Pretty, FNP as PCP - General (Nurse Practitioner)    Assessment:   This is a routine wellness examination for [place].  Exercise Activities and Dietary recommendations Current Exercise Habits: Home exercise routine, Type of exercise: walking, Time (Minutes): 20, Frequency (Times/Week): 7, Weekly Exercise (Minutes/Week): 140, Intensity: Mild  Goals    . Exercise 3x per week (30 min per time)     Continue your daily exercise routine and keep up the great work;       Fall Risk Fall Risk  08/13/2018 05/12/2018 02/16/2018 02/09/2018 01/01/2018  Falls in the past year? 0 No No No No   Is the patient's home free of loose throw rugs in walkways, pet beds, electrical cords, etc?   yes      Grab bars in the bathroom? yes      Handrails on the stairs?   yes      Adequate lighting?   yes  Timed Get Up and Go performed:   Depression Screen PHQ 2/9 Scores 08/13/2018 05/12/2018 02/16/2018 02/09/2018  PHQ - 2 Score 0 0 0 0     Cognitive Function MMSE - Mini Mental State Exam 08/13/2018  Orientation to time 3  Orientation to Place 5  Registration 2  Attention/  Calculation 5  Recall 2  Language- name 2 objects 2  Language- repeat 1  Language- follow 3 step command 3  Language- read & follow direction 1  Write a sentence 1  Copy design 0  Total score 25    Patient scored a 25 out of 30      Immunization History  Administered Date(s) Administered  . Influenza Whole 05/26/2010  . Influenza, High Dose Seasonal PF 07/09/2016, 07/29/2017, 08/13/2018  .  Influenza,inj,Quad PF,6+ Mos 06/29/2013, 08/11/2014, 05/30/2015  . Pneumococcal Conjugate-13 12/08/2014  . Pneumococcal Polysaccharide-23 05/26/2000  . Td 05/26/2000  . Zoster 05/27/2007    Qualifies for Shingles Vaccine? Yes   Screening Tests Health Maintenance  Topic Date Due  . TETANUS/TDAP  02/10/2019 (Originally 05/26/2010)  . INFLUENZA VACCINE  Completed  . DEXA SCAN  Completed  . PNA vac Low Risk Adult  Completed    Cancer Screenings: Breast:  Up to date on Mammogram? Yes   Up to date of Bone Density/Dexa? Yes Colorectal:   Additional Screenings:  Hepatitis C Screening:      Plan:   Patient to Follow up with PCP as planned.  Patient encouraged to continue reading and working crossword puzzles.  Flu vaccine given today  I have personally reviewed and noted the following in the patient's chart:   . Medical and social history . Use of alcohol, tobacco or illicit drugs  . Current medications and supplements . Functional ability and status . Nutritional status . Physical activity . Advanced directives . List of other physicians . Hospitalizations, surgeries, and ER visits in previous 12 months . Vitals . Screenings to include cognitive, depression, and falls . Referrals and appointments  In addition, I have reviewed and discussed with patient certain preventive protocols, quality metrics, and best practice recommendations. A written personalized care plan for preventive services as well as general preventive health recommendations were provided to patient.      Gareth Morgan, LPN  [id number]  I have reviewed and agree with the above AWV documentation.   Mary-Margaret Hassell Done, FNP

## 2018-08-17 ENCOUNTER — Other Ambulatory Visit: Payer: Self-pay | Admitting: Nurse Practitioner

## 2018-08-17 DIAGNOSIS — J410 Simple chronic bronchitis: Secondary | ICD-10-CM

## 2018-10-17 ENCOUNTER — Other Ambulatory Visit: Payer: Self-pay | Admitting: Nurse Practitioner

## 2018-10-17 DIAGNOSIS — J410 Simple chronic bronchitis: Secondary | ICD-10-CM

## 2018-11-12 ENCOUNTER — Ambulatory Visit: Payer: MEDICARE | Admitting: Nurse Practitioner

## 2018-11-30 MED ORDER — POLYMYXIN B-TRIMETHOPRIM 10000-0.1 UNIT/ML-% OP SOLN: 1.0000 [drp] | OPHTHALMIC | 0 refills | Status: DC

## 2018-12-28 ENCOUNTER — Other Ambulatory Visit: Payer: Self-pay | Admitting: Nurse Practitioner

## 2018-12-28 DIAGNOSIS — I10 Essential (primary) hypertension: Secondary | ICD-10-CM

## 2019-01-06 ENCOUNTER — Other Ambulatory Visit: Payer: Self-pay | Admitting: Nurse Practitioner

## 2019-01-06 DIAGNOSIS — J410 Simple chronic bronchitis: Secondary | ICD-10-CM

## 2019-01-25 ENCOUNTER — Other Ambulatory Visit: Payer: Self-pay | Admitting: Nurse Practitioner

## 2019-01-25 DIAGNOSIS — I10 Essential (primary) hypertension: Secondary | ICD-10-CM

## 2019-01-25 DIAGNOSIS — J410 Simple chronic bronchitis: Secondary | ICD-10-CM

## 2019-01-25 MED ORDER — FLUTICASONE-SALMETEROL 100-50 MCG/DOSE IN AEPB: 1.0000 | INHALATION_SPRAY | Freq: Two times a day (BID) | RESPIRATORY_TRACT | 0 refills | Status: DC

## 2019-01-25 MED ORDER — LISINOPRIL-HYDROCHLOROTHIAZIDE 10-12.5 MG PO TABS: ORAL_TABLET | ORAL | 0 refills | Status: DC

## 2019-02-02 ENCOUNTER — Other Ambulatory Visit: Payer: Self-pay

## 2019-02-02 ENCOUNTER — Ambulatory Visit: Payer: MEDICARE | Admitting: Nurse Practitioner

## 2019-02-02 DIAGNOSIS — J410 Simple chronic bronchitis: Secondary | ICD-10-CM

## 2019-02-03 ENCOUNTER — Other Ambulatory Visit: Payer: Self-pay

## 2019-02-04 ENCOUNTER — Ambulatory Visit (INDEPENDENT_AMBULATORY_CARE_PROVIDER_SITE_OTHER): Payer: MEDICARE | Admitting: Family Medicine

## 2019-02-04 ENCOUNTER — Encounter: Payer: Self-pay | Admitting: Family Medicine

## 2019-02-04 VITALS — BP 165/88 | HR 84 | Temp 97.8°F | Ht 60.0 in | Wt 94.0 lb

## 2019-02-04 DIAGNOSIS — R202 Paresthesia of skin: Secondary | ICD-10-CM | POA: Diagnosis not present

## 2019-02-04 DIAGNOSIS — I10 Essential (primary) hypertension: Secondary | ICD-10-CM | POA: Diagnosis not present

## 2019-02-04 DIAGNOSIS — E785 Hyperlipidemia, unspecified: Secondary | ICD-10-CM | POA: Diagnosis not present

## 2019-02-04 DIAGNOSIS — J410 Simple chronic bronchitis: Secondary | ICD-10-CM

## 2019-02-04 MED ORDER — FLUTICASONE-SALMETEROL 100-50 MCG/DOSE IN AEPB: 1.0000 | INHALATION_SPRAY | Freq: Two times a day (BID) | RESPIRATORY_TRACT | 0 refills | Status: DC

## 2019-02-04 MED ORDER — LISINOPRIL-HYDROCHLOROTHIAZIDE 10-12.5 MG PO TABS: ORAL_TABLET | ORAL | 0 refills | Status: DC

## 2019-02-04 MED ORDER — ROSUVASTATIN CALCIUM 10 MG PO TABS: ORAL_TABLET | ORAL | 1 refills | Status: DC

## 2019-02-04 NOTE — Progress Notes (Signed)
Subjective:  Patient ID: Morgan Spears, female    DOB: 1924-12-25, 85 y.o.   MRN: [id number]  Chief Complaint:  Medical Management of Chronic Issues   HPI: Morgan Spears is a 85 y.o. female presenting on 02/04/2019 for Medical Management of Chronic Issues   1. Paresthesia of both feet  Pt reports tingling of bilateral feet. States the bottom of both of her feet tingle and burn. States she has tried rubbing alcohol without relief. She denies numbness, loss of function, or injury. No swelling or erythema.    2. Essential hypertension  Complaint with meds - Yes Checking BP at home - No Exercising Regularly - No Watching Salt intake - Yes Pertinent ROS:  Headache - No Chest pain - No Dyspnea - No Palpitations - No LE edema - No They report good compliance with medications and can restate their regimen by memory. No medication side effects.  BP Readings from Last 3 Encounters:  02/04/19 (!) 165/88  08/13/18 (!) 142/84  08/13/18 (!) 152/83     3. Hyperlipidemia with target LDL less than 100  Compliant with medications. No associated side effects. Does try to watch diet. Does not exercise on a regular basis.    4. Simple chronic bronchitis ([place])  Well controlled with Advair. No cough, shortness of breath, or sputum production.      Relevant past medical, surgical, family, and social history reviewed and updated as indicated.  Allergies and medications reviewed and updated.   Past Medical History:  Diagnosis Date  . Cataract   . COPD (chronic obstructive pulmonary disease) ([place])   . Diffuse cystic mastopathy   . Frequency of urination and polyuria   . Hypertension   . Osteopenia   . Other and unspecified hyperlipidemia   . Postmenopausal atrophic vaginitis     History reviewed. No pertinent surgical history.  Social History   Socioeconomic History  . Marital status: Widowed    Spouse name: Not on file  . Number of children: 2  . Years of education:  12th  . Highest education level: 12th grade  Occupational History  . Not on file  Social Needs  . Financial resource strain: Not hard at all  . Food insecurity    Worry: Never true    Inability: Never true  . Transportation needs    Medical: No    Non-medical: No  Tobacco Use  . Smoking status: Never Smoker  . Smokeless tobacco: Never Used  Substance and Sexual Activity  . Alcohol use: No    Comment: occassioal wine - 1-2 servings per week  . Drug use: No  . Sexual activity: Never  Lifestyle  . Physical activity    Days per week: 7 days    Minutes per session: 20 min  . Stress: Not at all  Relationships  . Social connections    Talks on phone: More than three times a week    Gets together: Once a week    Attends religious service: More than 4 times per year    Active member of club or organization: Yes    Attends meetings of clubs or organizations: More than 4 times per year    Relationship status: Widowed  . Intimate partner violence    Fear of current or ex partner: Not on file    Emotionally abused: Not on file    Physically abused: Not on file    Forced sexual activity: Not on file  Other Topics Concern  .  Not on file  Social History Narrative  . Not on file    Outpatient Encounter Medications as of 02/04/2019  Medication Sig  . aspirin 81 MG EC tablet Take 81 mg by mouth daily.    . Calcium Carbonate-Vitamin D (CALCIUM 600 + D PO) Take 1 capsule by mouth daily.   . Cholecalciferol (VITAMIN D3) 1000 UNITS CAPS Take 1 capsule by mouth daily.    . Fluticasone-Salmeterol (ADVAIR) 100-50 MCG/DOSE AEPB Inhale 1 puff into the lungs 2 (two) times a day.  . lisinopril-hydrochlorothiazide (ZESTORETIC) 10-12.5 MG tablet TAKE ONE (1) TABLET EACH DAY, needs to be seen for future refills  . rosuvastatin (CRESTOR) 10 MG tablet TAKE ONE (1) TABLET EACH DAY  . trimethoprim-polymyxin b (POLYTRIM) ophthalmic solution Place 1 drop into both eyes every 4 (four) hours.  .  [DISCONTINUED] Fluticasone-Salmeterol (ADVAIR) 100-50 MCG/DOSE AEPB Inhale 1 puff into the lungs 2 (two) times a day.  . [DISCONTINUED] lisinopril-hydrochlorothiazide (ZESTORETIC) 10-12.5 MG tablet TAKE ONE (1) TABLET EACH DAY, needs to be seen for future refills  . [DISCONTINUED] rosuvastatin (CRESTOR) 10 MG tablet TAKE ONE (1) TABLET EACH DAY  . [DISCONTINUED] geriatric multivitamins-minerals (ELDERTONIC/GEVRABON) ELIX Take 15 mLs by mouth daily.     No facility-administered encounter medications on file as of 02/04/2019.     Allergies  Allergen Reactions  . Evista [Raloxifene Hydrochloride]   . Evista [Raloxifene]     syncope    Review of Systems  Constitutional: Negative for appetite change, chills, fatigue, fever and unexpected weight change.  Eyes: Negative for photophobia and visual disturbance.  Respiratory: Negative for cough, chest tightness, shortness of breath and wheezing.   Cardiovascular: Negative for chest pain, palpitations and leg swelling.  Gastrointestinal: Negative for abdominal pain, anal bleeding and blood in stool.  Endocrine: Negative for polydipsia, polyphagia and polyuria.  Genitourinary: Negative for decreased urine volume and difficulty urinating.  Musculoskeletal: Negative for arthralgias, gait problem, joint swelling and myalgias.  Neurological: Negative for dizziness, tremors, seizures, syncope, facial asymmetry, speech difficulty, weakness, light-headedness, numbness and headaches.       Tingling to bilateral feet  Psychiatric/Behavioral: Negative for confusion.  All other systems reviewed and are negative.       Objective:  BP (!) 165/88   Pulse 84   Temp 97.8 F (36.6 C) (Oral)   Ht 5' (1.524 m)   Wt 94 lb (42.6 kg)   BMI 18.36 kg/m    Wt Readings from Last 3 Encounters:  02/04/19 94 lb (42.6 kg)  08/13/18 98 lb 9.6 oz (44.7 kg)  08/13/18 98 lb 9.6 oz (44.7 kg)    Physical Exam Vitals signs and nursing note reviewed.  Constitutional:       General: She is not in acute distress.    Appearance: Normal appearance. She is well-developed and well-groomed. She is not ill-appearing, toxic-appearing or diaphoretic.  HENT:     Head: Normocephalic and atraumatic.     Jaw: There is normal jaw occlusion.     Right Ear: Hearing normal.     Left Ear: Hearing normal.     Nose: Nose normal.     Mouth/Throat:     Lips: Pink.     Mouth: Mucous membranes are moist.     Pharynx: Oropharynx is clear. Uvula midline.  Eyes:     General: Lids are normal.     Extraocular Movements: Extraocular movements intact.     Conjunctiva/sclera: Conjunctivae normal.     Pupils: Pupils are equal, round, and  reactive to light.  Neck:     Musculoskeletal: Normal range of motion and neck supple.     Thyroid: No thyroid mass, thyromegaly or thyroid tenderness.     Vascular: No carotid bruit or JVD.     Trachea: Trachea and phonation normal.  Cardiovascular:     Rate and Rhythm: Normal rate and regular rhythm.     Chest Wall: PMI is not displaced.     Pulses: Normal pulses.     Heart sounds: Normal heart sounds. No murmur. No friction rub. No gallop.   Pulmonary:     Effort: Pulmonary effort is normal. No respiratory distress.     Breath sounds: Normal breath sounds. No wheezing.  Abdominal:     General: Bowel sounds are normal. There is no distension or abdominal bruit.     Palpations: Abdomen is soft. There is no hepatomegaly or splenomegaly.     Tenderness: There is no abdominal tenderness. There is no right CVA tenderness or left CVA tenderness.     Hernia: No hernia is present.  Musculoskeletal: Normal range of motion.     Right lower leg: No edema.     Left lower leg: No edema.  Lymphadenopathy:     Cervical: No cervical adenopathy.  Skin:    General: Skin is warm and dry.     Capillary Refill: Capillary refill takes less than 2 seconds.     Coloration: Skin is not cyanotic, jaundiced or pale.     Findings: No rash.  Neurological:      General: No focal deficit present.     Mental Status: She is alert and oriented to person, place, and time.     Cranial Nerves: Cranial nerves are intact.     Sensory: Sensation is intact.     Motor: Motor function is intact.     Coordination: Coordination is intact.     Gait: Gait is intact.     Deep Tendon Reflexes: Reflexes are normal and symmetric.  Psychiatric:        Attention and Perception: Attention and perception normal.        Mood and Affect: Mood and affect normal.        Speech: Speech normal.        Behavior: Behavior normal. Behavior is cooperative.        Thought Content: Thought content normal.        Cognition and Memory: Cognition and memory normal.        Judgment: Judgment normal.     Results for orders placed or performed in visit on 05/12/18  CMP14+EGFR  Result Value Ref Range   Glucose 81 65 - 99 mg/dL   BUN 16 10 - 36 mg/dL   Creatinine, Ser 0.89 0.57 - 1.00 mg/dL   GFR calc non Af Amer 57 (L) >59 mL/min/1.73   GFR calc Af Amer 66 >59 mL/min/1.73   BUN/Creatinine Ratio 18 12 - 28   Sodium 139 134 - 144 mmol/L   Potassium 4.6 3.5 - 5.2 mmol/L   Chloride 98 96 - 106 mmol/L   CO2 26 20 - 29 mmol/L   Calcium 10.2 8.7 - 10.3 mg/dL   Total Protein 6.7 6.0 - 8.5 g/dL   Albumin 4.4 3.2 - 4.6 g/dL   Globulin, Total 2.3 1.5 - 4.5 g/dL   Albumin/Globulin Ratio 1.9 1.2 - 2.2   Bilirubin Total 0.6 0.0 - 1.2 mg/dL   Alkaline Phosphatase 53 39 - 117 IU/L   AST  43 (H) 0 - 40 IU/L   ALT 24 0 - 32 IU/L  Lipid panel  Result Value Ref Range   Cholesterol, Total 145 100 - 199 mg/dL   Triglycerides 69 0 - 149 mg/dL   HDL 54 >39 mg/dL   VLDL Cholesterol Cal 14 5 - 40 mg/dL   LDL Calculated 77 0 - 99 mg/dL   Chol/HDL Ratio 2.7 0.0 - 4.4 ratio       Pertinent labs & imaging results that were available during my care of the patient were reviewed by me and considered in my medical decision making.  Assessment & Plan:  Morgan Spears was seen today for medical  management of chronic issues.  Diagnoses and all orders for this visit:  Paresthesia of both feet Symptomatic care discussed. Try epsom salt soak and topical creams. Will check B12 today. If symptoms persist or worsen, may consider medication therapy.  -     Vitamin B12  Essential hypertension Blood pressure elevated today. DASH diet discussed. Report any persistent highs or lows. Labs pending. Continue below.  -     CBC with Differential/Platelet -     CMP14+EGFR -     Lipid panel -     TSH -     lisinopril-hydrochlorothiazide (ZESTORETIC) 10-12.5 MG tablet; TAKE ONE (1) TABLET EACH DAY, needs to be seen for future refills  Hyperlipidemia with target LDL less than 100 Diet and exercise encouraged. Labs pending. Continue below.  -     Lipid panel -     rosuvastatin (CRESTOR) 10 MG tablet; TAKE ONE (1) TABLET EACH DAY  Simple chronic bronchitis (HCC) Doing well on daily Advair. No breakthrough symptoms. Continue below.  -     Fluticasone-Salmeterol (ADVAIR) 100-50 MCG/DOSE AEPB; Inhale 1 puff into the lungs 2 (two) times a day.     Continue all other maintenance medications.  Follow up plan: Return in about 3 months (around 05/07/2019) for HTN.   The above assessment and management plan was discussed with the patient. The patient verbalized understanding of and has agreed to the management plan. Patient is aware to call the clinic if symptoms persist or worsen. Patient is aware when to return to the clinic for a follow-up visit. Patient educated on when it is appropriate to go to the emergency department.   Monia Pouch, FNP-C [facility] [phone]

## 2019-02-04 NOTE — Patient Instructions (Signed)
Capsaicin topical

## 2019-02-05 LAB — CBC WITH DIFFERENTIAL/PLATELET
Basophils Absolute: 0.1 10*3/uL (ref 0.0–0.2)
Basos: 1 %
EOS (ABSOLUTE): 0.1 10*3/uL (ref 0.0–0.4)
Eos: 2 %
Hematocrit: 44.4 % (ref 34.0–46.6)
Hemoglobin: 14.5 g/dL (ref 11.1–15.9)
Immature Grans (Abs): 0 10*3/uL (ref 0.0–0.1)
Immature Granulocytes: 1 %
Lymphocytes Absolute: 1.6 10*3/uL (ref 0.7–3.1)
Lymphs: 24 %
MCH: 30.8 pg (ref 26.6–33.0)
MCHC: 32.7 g/dL (ref 31.5–35.7)
MCV: 94 fL (ref 79–97)
Monocytes Absolute: 0.8 10*3/uL (ref 0.1–0.9)
Monocytes: 11 %
Neutrophils Absolute: 4.2 10*3/uL (ref 1.4–7.0)
Neutrophils: 61 %
Platelets: 192 10*3/uL (ref 150–450)
RBC: 4.71 x10E6/uL (ref 3.77–5.28)
RDW: 14.3 % (ref 11.7–15.4)
WBC: 6.8 10*3/uL (ref 3.4–10.8)

## 2019-02-05 LAB — CMP14+EGFR
ALT: 20 IU/L (ref 0–32)
AST: 36 IU/L (ref 0–40)
Albumin/Globulin Ratio: 2 (ref 1.2–2.2)
Albumin: 4.5 g/dL (ref 3.5–4.6)
Alkaline Phosphatase: 45 IU/L (ref 39–117)
BUN/Creatinine Ratio: 17 (ref 12–28)
BUN: 16 mg/dL (ref 10–36)
Bilirubin Total: 0.6 mg/dL (ref 0.0–1.2)
CO2: 25 mmol/L (ref 20–29)
Calcium: 10 mg/dL (ref 8.7–10.3)
Chloride: 95 mmol/L — ABNORMAL LOW (ref 96–106)
Creatinine, Ser: 0.95 mg/dL (ref 0.57–1.00)
GFR calc Af Amer: 61 mL/min/{1.73_m2} (ref >59)
GFR calc non Af Amer: 53 mL/min/{1.73_m2} — ABNORMAL LOW (ref >59)
Globulin, Total: 2.2 g/dL (ref 1.5–4.5)
Glucose: 81 mg/dL (ref 65–99)
Potassium: 4.3 mmol/L (ref 3.5–5.2)
Sodium: 135 mmol/L (ref 134–144)
Total Protein: 6.7 g/dL (ref 6.0–8.5)

## 2019-02-05 LAB — LIPID PANEL
Chol/HDL Ratio: 2.4 ratio (ref 0.0–4.4)
Cholesterol, Total: 145 mg/dL (ref 100–199)
HDL: 60 mg/dL (ref >39)
LDL Calculated: 72 mg/dL (ref 0–99)
Triglycerides: 67 mg/dL (ref 0–149)
VLDL Cholesterol Cal: 13 mg/dL (ref 5–40)

## 2019-02-05 LAB — TSH: TSH: 4.17 u[IU]/mL (ref 0.450–4.500)

## 2019-02-05 LAB — VITAMIN B12: Vitamin B-12: 640 pg/mL (ref 232–1245)

## 2019-02-25 ENCOUNTER — Ambulatory Visit: Payer: MEDICARE | Admitting: Nurse Practitioner

## 2019-04-05 ENCOUNTER — Other Ambulatory Visit: Payer: Self-pay | Admitting: Family Medicine

## 2019-04-05 DIAGNOSIS — J410 Simple chronic bronchitis: Secondary | ICD-10-CM

## 2019-04-18 DIAGNOSIS — I10 Essential (primary) hypertension: Secondary | ICD-10-CM

## 2019-04-19 MED ORDER — LISINOPRIL-HYDROCHLOROTHIAZIDE 10-12.5 MG PO TABS: ORAL_TABLET | ORAL | 0 refills | Status: DC

## 2019-05-07 ENCOUNTER — Other Ambulatory Visit: Payer: Self-pay | Admitting: Family Medicine

## 2019-05-07 DIAGNOSIS — J410 Simple chronic bronchitis: Secondary | ICD-10-CM

## 2019-05-17 ENCOUNTER — Other Ambulatory Visit: Payer: Self-pay

## 2019-05-18 ENCOUNTER — Ambulatory Visit (INDEPENDENT_AMBULATORY_CARE_PROVIDER_SITE_OTHER): Payer: MEDICARE | Admitting: Nurse Practitioner

## 2019-05-18 ENCOUNTER — Encounter: Payer: Self-pay | Admitting: Nurse Practitioner

## 2019-05-18 VITALS — BP 138/88 | HR 70 | Temp 97.4°F | Ht 60.0 in | Wt 87.0 lb

## 2019-05-18 DIAGNOSIS — I4821 Permanent atrial fibrillation: Secondary | ICD-10-CM

## 2019-05-18 DIAGNOSIS — J410 Simple chronic bronchitis: Secondary | ICD-10-CM | POA: Diagnosis not present

## 2019-05-18 DIAGNOSIS — I10 Essential (primary) hypertension: Secondary | ICD-10-CM

## 2019-05-18 DIAGNOSIS — E785 Hyperlipidemia, unspecified: Secondary | ICD-10-CM | POA: Diagnosis not present

## 2019-05-18 DIAGNOSIS — M8580 Other specified disorders of bone density and structure, unspecified site: Secondary | ICD-10-CM

## 2019-05-18 DIAGNOSIS — Z23 Encounter for immunization: Secondary | ICD-10-CM

## 2019-05-18 MED ORDER — FLUTICASONE-SALMETEROL 100-50 MCG/DOSE IN AEPB: INHALATION_SPRAY | RESPIRATORY_TRACT | 5 refills | Status: DC

## 2019-05-18 MED ORDER — ROSUVASTATIN CALCIUM 10 MG PO TABS: ORAL_TABLET | ORAL | 1 refills | Status: DC

## 2019-05-18 MED ORDER — LISINOPRIL-HYDROCHLOROTHIAZIDE 10-12.5 MG PO TABS: ORAL_TABLET | ORAL | 1 refills | Status: DC

## 2019-05-18 NOTE — Progress Notes (Signed)
Subjective:    Patient ID: Morgan Spears, female    DOB: [birth year], 85 y.o.   MRN: [id number]   Chief Complaint: medical management of chronic issues   HPI:  1. Essential hypertension No c/o chest pain, sob or headache. Does not check blood pressure at home. BP Readings from Last 3 Encounters:  02/04/19 (!) 165/88  08/13/18 (!) 142/84  08/13/18 (!) 152/83     2. Hyperlipidemia with target LDL less than 100 She really does not watch diet much. Lab Results  Component Value Date   CHOL 145 02/04/2019   HDL 60 02/04/2019   LDLCALC 72 02/04/2019   TRIG 67 02/04/2019   CHOLHDL 2.4 02/04/2019     3. Permanent atrial fibrillation She has been in atrial fib for several years. She is on a baby aspirin daily  4. Simple chronic bronchitis (HCC) Denies nay cough  5. Osteopenia, unspecified location Last dexasccan was done  Wt Readings from Last 3 Encounters:  05/18/19 87 lb (39.5 kg)  02/04/19 94 lb (42.6 kg)  08/13/18 98 lb 9.6 oz (44.7 kg)   *daughter says she eats all the time.  Outpatient Encounter Medications as of 05/18/2019  Medication Sig  . aspirin 81 MG EC tablet Take 81 mg by mouth daily.    . Calcium Carbonate-Vitamin D (CALCIUM 600 + D PO) Take 1 capsule by mouth daily.   . Cholecalciferol (VITAMIN D3) 1000 UNITS CAPS Take 1 capsule by mouth daily.    . Fluticasone-Salmeterol (ADVAIR) 100-50 MCG/DOSE AEPB USE 1 INHALATION TWICE DAILY  . lisinopril-hydrochlorothiazide (ZESTORETIC) 10-12.5 MG tablet TAKE ONE (1) TABLET EACH DAY, needs to be seen for future refills  . rosuvastatin (CRESTOR) 10 MG tablet TAKE ONE (1) TABLET EACH DAY      Family History  Problem Relation Age of Onset  . Heart disease Mother   . Hypertension Mother   . Alcohol abuse Father   . Cancer Sister   . Emphysema Sister        smoker  . Heart disease Brother   . Alcohol abuse Brother     New complaints: None today  Social history: Lives by herself and daughter checks  in her frequently  Controlled substance contract: n/a    Review of Systems  Constitutional: Negative for activity change and appetite change.  HENT: Negative.   Eyes: Negative for pain.  Respiratory: Negative for shortness of breath.   Cardiovascular: Negative for chest pain, palpitations and leg swelling.  Gastrointestinal: Negative for abdominal pain.  Endocrine: Negative for polydipsia.  Genitourinary: Negative.   Skin: Negative for rash.  Neurological: Negative for dizziness, weakness and headaches.  Hematological: Does not bruise/bleed easily.  Psychiatric/Behavioral: Negative.   All other systems reviewed and are negative.      Objective:   Physical Exam Vitals signs and nursing note reviewed.  Constitutional:      General: She is not in acute distress.    Appearance: Normal appearance. She is well-developed.  HENT:     Head: Normocephalic.     Nose: Nose normal.  Eyes:     Pupils: Pupils are equal, round, and reactive to light.  Neck:     Musculoskeletal: Normal range of motion and neck supple.     Vascular: No carotid bruit or JVD.  Cardiovascular:     Rate and Rhythm: Normal rate and regular rhythm.     Heart sounds: Normal heart sounds.  Pulmonary:     Effort: Pulmonary effort is normal. No  respiratory distress.     Breath sounds: Normal breath sounds. No wheezing or rales.  Chest:     Chest wall: No tenderness.  Abdominal:     General: Bowel sounds are normal. There is no distension or abdominal bruit.     Palpations: Abdomen is soft. There is no hepatomegaly, splenomegaly, mass or pulsatile mass.     Tenderness: There is no abdominal tenderness.  Musculoskeletal: Normal range of motion.  Lymphadenopathy:     Cervical: No cervical adenopathy.  Skin:    General: Skin is warm and dry.  Neurological:     Mental Status: She is alert and oriented to person, place, and time.     Deep Tendon Reflexes: Reflexes are normal and symmetric.  Psychiatric:         Behavior: Behavior normal.        Thought Content: Thought content normal.        Judgment: Judgment normal.    BP 138/88   Pulse 70   Temp (!) 97.4 F (36.3 C) (Temporal)   Ht 5' (1.524 m)   Wt 87 lb (39.5 kg)   SpO2 98%   BMI 16.99 kg/m         Assessment & Plan:  Luna Audia comes in today with chief complaint of Medical Management of Chronic Issues   Diagnosis and orders addressed:  1. Essential hypertension [place] sodium diet - CMP14+EGFR - lisinopril-hydrochlorothiazide (ZESTORETIC) 10-12.5 MG tablet; TAKE ONE (1) TABLET EACH DAY, needs to be seen for future refills  Dispense: 90 tablet; Refill: 1  2. Hyperlipidemia with target LDL less than 100 Low fat diet - rosuvastatin (CRESTOR) 10 MG tablet; TAKE ONE (1) TABLET EACH DAY  Dispense: 90 tablet; Refill: 1  3. Permanent atrial fibrillation  4. Simple chronic bronchitis (HCC) - Fluticasone-Salmeterol (ADVAIR) 100-50 MCG/DOSE AEPB; USE 1 INHALATION TWICE DAILY  Dispense: 60 each; Refill: 5  5. Osteopenia, unspecified location Weight bearing exercises   Labs pending Health Maintenance reviewed Diet and exercise encouraged  Follow up plan: 6 months   [place], FNP

## 2019-05-18 NOTE — Patient Instructions (Signed)

## 2019-05-19 LAB — CMP14+EGFR
ALT: 21 IU/L (ref 0–32)
AST: 35 IU/L (ref 0–40)
Albumin/Globulin Ratio: 1.7 (ref 1.2–2.2)
Albumin: 4.5 g/dL (ref 3.5–4.6)
Alkaline Phosphatase: 56 IU/L (ref 39–117)
BUN/Creatinine Ratio: 22 (ref 12–28)
BUN: 22 mg/dL (ref 10–36)
Bilirubin Total: 0.8 mg/dL (ref 0.0–1.2)
CO2: 25 mmol/L (ref 20–29)
Calcium: 10.3 mg/dL (ref 8.7–10.3)
Chloride: 98 mmol/L (ref 96–106)
Creatinine, Ser: 1.02 mg/dL — ABNORMAL HIGH (ref 0.57–1.00)
GFR calc Af Amer: 56 mL/min/{1.73_m2} — ABNORMAL LOW (ref >59)
GFR calc non Af Amer: 48 mL/min/{1.73_m2} — ABNORMAL LOW (ref >59)
Globulin, Total: 2.6 g/dL (ref 1.5–4.5)
Glucose: 81 mg/dL (ref 65–99)
Potassium: 5 mmol/L (ref 3.5–5.2)
Sodium: 140 mmol/L (ref 134–144)
Total Protein: 7.1 g/dL (ref 6.0–8.5)

## 2019-08-16 ENCOUNTER — Other Ambulatory Visit: Payer: Self-pay | Admitting: Nurse Practitioner

## 2019-08-16 DIAGNOSIS — I10 Essential (primary) hypertension: Secondary | ICD-10-CM

## 2019-11-04 ENCOUNTER — Encounter: Payer: Self-pay | Admitting: *Deleted

## 2019-11-07 ENCOUNTER — Encounter: Payer: Self-pay | Admitting: Nurse Practitioner

## 2019-11-07 DIAGNOSIS — I10 Essential (primary) hypertension: Secondary | ICD-10-CM

## 2019-11-08 MED ORDER — LISINOPRIL-HYDROCHLOROTHIAZIDE 10-12.5 MG PO TABS: ORAL_TABLET | ORAL | 0 refills | Status: DC

## 2019-11-16 ENCOUNTER — Ambulatory Visit: Payer: Self-pay | Admitting: Nurse Practitioner

## 2019-11-17 ENCOUNTER — Ambulatory Visit: Payer: Self-pay | Admitting: Nurse Practitioner

## 2019-11-19 ENCOUNTER — Other Ambulatory Visit: Payer: Self-pay

## 2019-11-19 ENCOUNTER — Ambulatory Visit (INDEPENDENT_AMBULATORY_CARE_PROVIDER_SITE_OTHER): Payer: MEDICARE | Admitting: Nurse Practitioner

## 2019-11-19 ENCOUNTER — Encounter: Payer: Self-pay | Admitting: Nurse Practitioner

## 2019-11-19 VITALS — BP 132/84 | HR 116 | Temp 97.8°F | Resp 20 | Ht 60.0 in | Wt 91.0 lb

## 2019-11-19 DIAGNOSIS — E785 Hyperlipidemia, unspecified: Secondary | ICD-10-CM | POA: Diagnosis not present

## 2019-11-19 DIAGNOSIS — I10 Essential (primary) hypertension: Secondary | ICD-10-CM | POA: Diagnosis not present

## 2019-11-19 DIAGNOSIS — J410 Simple chronic bronchitis: Secondary | ICD-10-CM

## 2019-11-19 DIAGNOSIS — I4821 Permanent atrial fibrillation: Secondary | ICD-10-CM | POA: Diagnosis not present

## 2019-11-19 LAB — CMP14+EGFR
ALT: 27 IU/L (ref 0–32)
AST: 45 IU/L — ABNORMAL HIGH (ref 0–40)
Albumin/Globulin Ratio: 1.8 (ref 1.2–2.2)
Albumin: 4.4 g/dL (ref 3.5–4.6)
Alkaline Phosphatase: 50 IU/L (ref 39–117)
BUN/Creatinine Ratio: 15 (ref 12–28)
BUN: 13 mg/dL (ref 10–36)
Bilirubin Total: 0.7 mg/dL (ref 0.0–1.2)
CO2: 28 mmol/L (ref 20–29)
Calcium: 9.7 mg/dL (ref 8.7–10.3)
Chloride: 93 mmol/L — ABNORMAL LOW (ref 96–106)
Creatinine, Ser: 0.84 mg/dL (ref 0.57–1.00)
GFR calc Af Amer: 70 mL/min/{1.73_m2} (ref >59)
GFR calc non Af Amer: 61 mL/min/{1.73_m2} (ref >59)
Globulin, Total: 2.4 g/dL (ref 1.5–4.5)
Glucose: 91 mg/dL (ref 65–99)
Potassium: 4.7 mmol/L (ref 3.5–5.2)
Sodium: 135 mmol/L (ref 134–144)
Total Protein: 6.8 g/dL (ref 6.0–8.5)

## 2019-11-19 LAB — CBC WITH DIFFERENTIAL/PLATELET
Basophils Absolute: 0 10*3/uL (ref 0.0–0.2)
Basos: 1 %
EOS (ABSOLUTE): 0.1 10*3/uL (ref 0.0–0.4)
Eos: 1 %
Hematocrit: 43 % (ref 34.0–46.6)
Hemoglobin: 14.5 g/dL (ref 11.1–15.9)
Immature Grans (Abs): 0.1 10*3/uL (ref 0.0–0.1)
Immature Granulocytes: 1 %
Lymphocytes Absolute: 1.5 10*3/uL (ref 0.7–3.1)
Lymphs: 18 %
MCH: 31.1 pg (ref 26.6–33.0)
MCHC: 33.7 g/dL (ref 31.5–35.7)
MCV: 92 fL (ref 79–97)
Monocytes Absolute: 0.9 10*3/uL (ref 0.1–0.9)
Monocytes: 10 %
Neutrophils Absolute: 6.1 10*3/uL (ref 1.4–7.0)
Neutrophils: 69 %
Platelets: 194 10*3/uL (ref 150–450)
RBC: 4.66 x10E6/uL (ref 3.77–5.28)
RDW: 14.6 % (ref 11.7–15.4)
WBC: 8.7 10*3/uL (ref 3.4–10.8)

## 2019-11-19 LAB — LIPID PANEL
Chol/HDL Ratio: 2.8 ratio (ref 0.0–4.4)
Cholesterol, Total: 180 mg/dL (ref 100–199)
HDL: 64 mg/dL (ref >39)
LDL Chol Calc (NIH): 101 mg/dL — ABNORMAL HIGH (ref 0–99)
Triglycerides: 83 mg/dL (ref 0–149)
VLDL Cholesterol Cal: 15 mg/dL (ref 5–40)

## 2019-11-19 MED ORDER — FLUTICASONE-SALMETEROL 100-50 MCG/DOSE IN AEPB: INHALATION_SPRAY | RESPIRATORY_TRACT | 5 refills | Status: DC

## 2019-11-19 MED ORDER — LISINOPRIL-HYDROCHLOROTHIAZIDE 10-12.5 MG PO TABS: ORAL_TABLET | ORAL | 1 refills | Status: DC

## 2019-11-19 MED ORDER — ROSUVASTATIN CALCIUM 10 MG PO TABS: ORAL_TABLET | ORAL | 1 refills | Status: DC

## 2019-11-19 NOTE — Progress Notes (Signed)
Subjective:    Patient ID: Morgan Spears, female    DOB: 1925/08/13, 86 y.o.   MRN: [id number]   Chief Complaint: Medical Management of Chronic Issues    HPI:  1. Essential hypertension No c/o chest pain, sob or headache. Does not check bloodpressure athome. BP Readings from Last 3 Encounters:  11/19/19 (!) 169/85  05/18/19 138/88  02/04/19 (!) 165/88     2. Hyperlipidemia with target LDL less than 100 Watches diet and does what exercise she can. Lab Results  Component Value Date   CHOL 145 02/04/2019   HDL 60 02/04/2019   LDLCALC 72 02/04/2019   TRIG 67 02/04/2019   CHOLHDL 2.4 02/04/2019     3. Permanent atrial fibrillation (HCC) deneis any palpitations or heart racing.    Outpatient Encounter Medications as of 11/19/2019  Medication Sig  . aspirin 81 MG EC tablet Take 81 mg by mouth daily.    . Calcium Carbonate-Vitamin D (CALCIUM 600 + D PO) Take 1 capsule by mouth daily.   . Fluticasone-Salmeterol (ADVAIR) 100-50 MCG/DOSE AEPB USE 1 INHALATION TWICE DAILY  . lisinopril-hydrochlorothiazide (ZESTORETIC) 10-12.5 MG tablet TAKE ONE (1) TABLET EACH DAY  . rosuvastatin (CRESTOR) 10 MG tablet TAKE ONE (1) TABLET EACH DAY   History reviewed. No pertinent surgical history.  Family History  Problem Relation Age of Onset  . Heart disease Mother   . Hypertension Mother   . Alcohol abuse Father   . Cancer Sister   . Emphysema Sister        smoker  . Heart disease Brother   . Alcohol abuse Brother     New complaints: None today  Social history: Lives by herself and her daughter checks on her daily  Controlled substance contract: n/a     Review of Systems  Constitutional: Negative for diaphoresis.  Eyes: Negative for pain.  Respiratory: Negative for shortness of breath.   Cardiovascular: Negative for chest pain, palpitations and leg swelling.  Gastrointestinal: Negative for abdominal pain.  Endocrine: Negative for polydipsia.  Skin: Negative for  rash.  Neurological: Negative for dizziness, weakness and headaches.  Hematological: Does not bruise/bleed easily.  All other systems reviewed and are negative.      Objective:   Physical Exam Vitals and nursing note reviewed.  Constitutional:      General: She is not in acute distress.    Appearance: Normal appearance. She is well-developed.  HENT:     Head: Normocephalic.     Nose: Nose normal.  Eyes:     Pupils: Pupils are equal, round, and reactive to light.  Neck:     Vascular: No carotid bruit or JVD.  Cardiovascular:     Rate and Rhythm: Normal rate and regular rhythm.     Heart sounds: Normal heart sounds.  Pulmonary:     Effort: Pulmonary effort is normal. No respiratory distress.     Breath sounds: Normal breath sounds. No wheezing or rales.  Chest:     Chest wall: No tenderness.  Abdominal:     General: Bowel sounds are normal. There is no distension or abdominal bruit.     Palpations: Abdomen is soft. There is no hepatomegaly, splenomegaly, mass or pulsatile mass.     Tenderness: There is no abdominal tenderness.  Musculoskeletal:        General: Normal range of motion.     Cervical back: Normal range of motion and neck supple.  Lymphadenopathy:     Cervical: No cervical adenopathy.  Skin:    General: Skin is warm and dry.  Neurological:     Mental Status: She is alert and oriented to person, place, and time.     Deep Tendon Reflexes: Reflexes are normal and symmetric.  Psychiatric:        Behavior: Behavior normal.        Thought Content: Thought content normal.        Judgment: Judgment normal.    BP 132/84 (BP Location: Left Arm, Cuff Size: Normal)   Pulse (!) 116   Temp 97.8 F (36.6 C) (Temporal)   Resp 20   Ht 5' (1.524 m)   Wt 91 lb (41.3 kg)   SpO2 99%   BMI 17.77 kg/m          Assessment & Plan:  Morgan Spears comes in today with chief complaint of Medical Management of Chronic Issues   Diagnosis and orders addressed:  1.  Essential hypertension Low sodium diet - lisinopril-hydrochlorothiazide (ZESTORETIC) 10-12.5 MG tablet; TAKE ONE (1) TABLET EACH DAY  Dispense: 90 tablet; Refill: 1 - CBC with Differential/Platelet - CMP14+EGFR  2. Hyperlipidemia with target LDL less than 100 Low fat diet - rosuvastatin (CRESTOR) 10 MG tablet; TAKE ONE (1) TABLET EACH DAY  Dispense: 90 tablet; Refill: 1 - Lipid panel  3. Permanent atrial fibrillation (HCC) Avoid over indulgence of caffeine  4. Simple chronic bronchitis ([facility]) - Fluticasone-Salmeterol (ADVAIR) 100-50 MCG/DOSE AEPB; USE 1 INHALATION TWICE DAILY  Dispense: 60 each; Refill: 5   Labs pending Health Maintenance reviewed Diet and exercise encouraged  Follow up plan: 6 months   Mary-Margaret Hassell Done, FNP

## 2019-11-19 NOTE — Patient Instructions (Signed)

## 2019-12-08 ENCOUNTER — Telehealth: Payer: Self-pay | Admitting: Nurse Practitioner

## 2019-12-08 NOTE — Chronic Care Management (AMB) (Signed)
  Chronic Care Management   Outreach Note  12/08/2019 Name: Kelbi Cutlip MRN: [id number] DOB: 07/17/1925  Hunter Abdulmalik is a 86 y.o. year old female who is a primary care patient of Chevis Pretty, [place]. I reached out to Loreta Ave by phone today in response to a referral sent by Ms. [employer] health plan.     An unsuccessful telephone outreach was attempted today. The patient was referred to the case management team for assistance with care management and care coordination.   Follow Up Plan: A HIPPA compliant phone message was left for the patient providing contact information and requesting a return call.  The care management team will reach out to the patient again over the next 7 days.  If patient returns call to provider office, please advise to call [facility]  at [address], [place], [place], [place] [address] Direct Dial: [phone] [email or web address] Website: [email or web address]

## 2019-12-13 NOTE — Chronic Care Management (AMB) (Signed)
°  Chronic Care Management   Outreach Note  12/13/2019 Name: Metzly Mikel MRN: [id number] DOB: 1925/02/07  Saraelizabeth Bollenbach is a 86 y.o. year old female who is a primary care patient of Chevis Pretty, [place]. I reached out to Loreta Ave by phone today in response to a referral sent by Ms. [employer] health plan.     A second unsuccessful telephone outreach was attempted today. The patient was referred to the case management team for assistance with care management and care coordination.   Follow Up Plan: A HIPPA compliant phone message was left for the patient providing contact information and requesting a return call.  The care management team will reach out to the patient again over the next 7 days.  If patient returns call to provider office, please advise to call [facility]  at [place], [place], [place], [place] [address] Direct Dial: [phone] [email or web address]: [email or web address]

## 2019-12-15 NOTE — Chronic Care Management (AMB) (Signed)
  Chronic Care Management   Outreach Note  12/15/2019 Name: Tomica Mondo MRN: [id number] DOB: August 07, 1925  Emyly Wetherholt is a 86 y.o. year old female who is a primary care patient of Chevis Pretty, [place]. I reached out to Loreta Ave by phone today in response to a referral sent by Ms. [employer] health plan.     Third unsuccessful telephone outreach was attempted today. The patient was referred to the case management team for assistance with care management and care coordination. The patient's primary care provider has been notified of our unsuccessful attempts to make or maintain contact with the patient. The care management team is pleased to engage with this patient at any time in the future should he/she be interested in assistance from the care management team.   Follow Up Plan: A HIPPA compliant phone message was left for the patient providing contact information and requesting a return call.  The care management team is available to follow up with the patient after provider conversation with the patient regarding recommendation for care management engagement and subsequent re-referral to the care management team.   Noreene Larsson, [place], [place], [place] [address] Direct Dial: [phone] [email or web address] Website: [email or web address]

## 2020-04-05 ENCOUNTER — Encounter: Payer: Self-pay | Admitting: Nurse Practitioner

## 2020-04-05 DIAGNOSIS — I10 Essential (primary) hypertension: Secondary | ICD-10-CM

## 2020-04-05 MED ORDER — LISINOPRIL-HYDROCHLOROTHIAZIDE 10-12.5 MG PO TABS: ORAL_TABLET | ORAL | 1 refills | Status: DC

## 2020-05-22 ENCOUNTER — Other Ambulatory Visit: Payer: Self-pay

## 2020-05-22 ENCOUNTER — Encounter: Payer: Self-pay | Admitting: Nurse Practitioner

## 2020-05-22 ENCOUNTER — Ambulatory Visit (INDEPENDENT_AMBULATORY_CARE_PROVIDER_SITE_OTHER): Payer: MEDICARE | Admitting: Nurse Practitioner

## 2020-05-22 VITALS — BP 162/91 | HR 118 | Temp 97.1°F | Resp 20 | Ht 60.0 in | Wt 85.0 lb

## 2020-05-22 DIAGNOSIS — I10 Essential (primary) hypertension: Secondary | ICD-10-CM

## 2020-05-22 DIAGNOSIS — E785 Hyperlipidemia, unspecified: Secondary | ICD-10-CM

## 2020-05-22 DIAGNOSIS — I4821 Permanent atrial fibrillation: Secondary | ICD-10-CM | POA: Diagnosis not present

## 2020-05-22 DIAGNOSIS — J410 Simple chronic bronchitis: Secondary | ICD-10-CM

## 2020-05-22 MED ORDER — LISINOPRIL-HYDROCHLOROTHIAZIDE 10-12.5 MG PO TABS: ORAL_TABLET | ORAL | 1 refills | Status: DC

## 2020-05-22 MED ORDER — FLUTICASONE-SALMETEROL 100-50 MCG/DOSE IN AEPB: INHALATION_SPRAY | RESPIRATORY_TRACT | 5 refills | Status: DC

## 2020-05-22 MED ORDER — METOPROLOL TARTRATE 25 MG PO TABS: 25.0000 mg | ORAL_TABLET | Freq: Two times a day (BID) | ORAL | 1 refills | Status: DC

## 2020-05-22 MED ORDER — ROSUVASTATIN CALCIUM 10 MG PO TABS: ORAL_TABLET | ORAL | 1 refills | Status: DC

## 2020-05-22 NOTE — Progress Notes (Signed)
Subjective:    Patient ID: Morgan Spears, female    DOB: [birth year], 86 y.o.   MRN: [id number]   Chief Complaint: Medical Management of Chronic Issues    HPI:  1. Essential hypertension BP Readings from Last 3 Encounters:  05/22/20 (!) 162/91  11/19/19 132/84  05/18/19 138/88  Does not check blood pressure at home. Takes medication as prescribed. Denies chest pain, SOB, or headaches.    2. Simple chronic bronchitis (HCC) Uses inhaler twice a day as prescribed, denies any breathing difficulty.   3. Hyperlipidemia with target LDL less than 100 Lab Results  Component Value Date   CHOL 180 11/19/2019   HDL 64 11/19/2019   LDLCALC 101 (H) 11/19/2019   TRIG 83 11/19/2019   CHOLHDL 2.8 11/19/2019  Avoids fried and fatty foods. Takes medication as prescribed. Walks daily.      Outpatient Encounter Medications as of 05/22/2020  Medication Sig  . Fluticasone-Salmeterol (ADVAIR) 100-50 MCG/DOSE AEPB USE 1 INHALATION TWICE DAILY  . lisinopril-hydrochlorothiazide (ZESTORETIC) 10-12.5 MG tablet TAKE ONE (1) TABLET EACH DAY  . rosuvastatin (CRESTOR) 10 MG tablet TAKE ONE (1) TABLET EACH DAY  . [DISCONTINUED] aspirin 81 MG EC tablet Take 81 mg by mouth daily.    . [DISCONTINUED] Calcium Carbonate-Vitamin D (CALCIUM 600 + D PO) Take 1 capsule by mouth daily.    No facility-administered encounter medications on file as of 05/22/2020.    History reviewed. No pertinent surgical history.  Family History  Problem Relation Age of Onset  . Heart disease Mother   . Hypertension Mother   . Alcohol abuse Father   . Cancer Sister   . Emphysema Sister        smoker  . Heart disease Brother   . Alcohol abuse Brother     New complaints: No new complaints today.   Social history: Lives home alone, daughter checks on her daily. Watches [employer].   Controlled substance contract: n/a    Review of Systems  Constitutional: Negative.   HENT: Negative.   Eyes: Negative.     Respiratory: Negative.   Cardiovascular: Negative.   Gastrointestinal: Negative.   Endocrine: Negative.   Genitourinary: Negative.   Musculoskeletal: Negative.   Skin: Negative.   Allergic/Immunologic: Negative.   Neurological: Negative.   Hematological: Negative.   Psychiatric/Behavioral: Negative.   All other systems reviewed and are negative.      Objective:   Physical Exam Vitals and nursing note reviewed.  Constitutional:      Appearance: Normal appearance.  HENT:     Head: Normocephalic and atraumatic.     Right Ear: Tympanic membrane, ear canal and external ear normal.     Left Ear: Tympanic membrane, ear canal and external ear normal.     Nose: Nose normal.     Mouth/Throat:     Mouth: Mucous membranes are moist.     Pharynx: Oropharynx is clear.  Eyes:     Pupils: Pupils are equal, round, and reactive to light.  Cardiovascular:     Rate and Rhythm: Tachycardia present. Rhythm regularly irregular.     Pulses: Normal pulses.     Heart sounds: Normal heart sounds.  Pulmonary:     Effort: Pulmonary effort is normal.     Breath sounds: Normal breath sounds.  Abdominal:     General: Bowel sounds are normal.  Musculoskeletal:        General: Normal range of motion.     Cervical back: Normal range of motion.  Skin:    General: Skin is warm and dry.     Capillary Refill: Capillary refill takes less than 2 seconds.  Neurological:     General: No focal deficit present.     Mental Status: Morgan Spears is alert and oriented to person, place, and time. Mental status is at baseline.  Psychiatric:        Mood and Affect: Mood normal.        Behavior: Behavior normal.        Thought Content: Thought content normal.        Judgment: Judgment normal.   BP (!) 162/91   Pulse (!) 118   Temp (!) 97.1 F (36.2 C) (Temporal)   Resp 20   Ht 5' (1.524 m)   Wt 85 lb (38.6 kg)   BMI 16.60 kg/m        Assessment & Plan:  Morgan Spears comes in today with chief complaint of  Medical Management of Chronic Issues   Diagnosis and orders addressed:  1. Essential hypertension Check blood pressure regularly and take medication as prescribed. Eat a heart healthy diet and avoid high salt foods.   2. Simple chronic bronchitis (HCC) Use inhaler as prescribed. Report any new or worsening symptoms.   3. Hyperlipidemia with target LDL less than 100 Avoid foods that are high in fat or fried. Exercise regularly.   4 atrial fib Added metoprolol for rate control. Avoid caffeine Check heart rate- if remains above 100 please let me know. Daily aspirin    CHF-0 HTN-1 Age>70-1 Diabetes-0 Hx stroke (2)-0 Vascular disease-1 Age >65-1 Sex- female-1 TOTAL 4    Labs pending Health Maintenance reviewed Diet and exercise encouraged  Follow up plan: Follow up in 6 months.    Mary-Margaret Hassell Done, FNP

## 2020-05-22 NOTE — Patient Instructions (Addendum)

## 2020-05-23 LAB — CBC WITH DIFFERENTIAL/PLATELET
Basophils Absolute: 0 10*3/uL (ref 0.0–0.2)
Basos: 1 %
EOS (ABSOLUTE): 0.1 10*3/uL (ref 0.0–0.4)
Eos: 1 %
Hematocrit: 42.4 % (ref 34.0–46.6)
Hemoglobin: 14.3 g/dL (ref 11.1–15.9)
Immature Grans (Abs): 0.1 10*3/uL (ref 0.0–0.1)
Immature Granulocytes: 1 %
Lymphocytes Absolute: 1.7 10*3/uL (ref 0.7–3.1)
Lymphs: 26 %
MCH: 31.8 pg (ref 26.6–33.0)
MCHC: 33.7 g/dL (ref 31.5–35.7)
MCV: 94 fL (ref 79–97)
Monocytes Absolute: 0.8 10*3/uL (ref 0.1–0.9)
Monocytes: 12 %
Neutrophils Absolute: 3.9 10*3/uL (ref 1.4–7.0)
Neutrophils: 59 %
Platelets: 202 10*3/uL (ref 150–450)
RBC: 4.49 x10E6/uL (ref 3.77–5.28)
RDW: 14 % (ref 11.7–15.4)
WBC: 6.5 10*3/uL (ref 3.4–10.8)

## 2020-05-23 LAB — CMP14+EGFR
ALT: 24 IU/L (ref 0–32)
AST: 39 IU/L (ref 0–40)
Albumin/Globulin Ratio: 1.9 (ref 1.2–2.2)
Albumin: 4.6 g/dL (ref 3.5–4.6)
Alkaline Phosphatase: 56 IU/L (ref 44–121)
BUN/Creatinine Ratio: 17 (ref 12–28)
BUN: 15 mg/dL (ref 10–36)
Bilirubin Total: 0.6 mg/dL (ref 0.0–1.2)
CO2: 28 mmol/L (ref 20–29)
Calcium: 9.7 mg/dL (ref 8.7–10.3)
Chloride: 97 mmol/L (ref 96–106)
Creatinine, Ser: 0.89 mg/dL (ref 0.57–1.00)
GFR calc Af Amer: 65 mL/min/{1.73_m2} (ref >59)
GFR calc non Af Amer: 56 mL/min/{1.73_m2} — ABNORMAL LOW (ref >59)
Globulin, Total: 2.4 g/dL (ref 1.5–4.5)
Glucose: 100 mg/dL — ABNORMAL HIGH (ref 65–99)
Potassium: 5.1 mmol/L (ref 3.5–5.2)
Sodium: 138 mmol/L (ref 134–144)
Total Protein: 7 g/dL (ref 6.0–8.5)

## 2020-05-23 LAB — LIPID PANEL
Chol/HDL Ratio: 3.2 ratio (ref 0.0–4.4)
Cholesterol, Total: 195 mg/dL (ref 100–199)
HDL: 61 mg/dL (ref >39)
LDL Chol Calc (NIH): 121 mg/dL — ABNORMAL HIGH (ref 0–99)
Triglycerides: 73 mg/dL (ref 0–149)
VLDL Cholesterol Cal: 13 mg/dL (ref 5–40)

## 2020-07-07 ENCOUNTER — Other Ambulatory Visit: Payer: Self-pay

## 2020-07-07 ENCOUNTER — Ambulatory Visit (INDEPENDENT_AMBULATORY_CARE_PROVIDER_SITE_OTHER): Payer: MEDICARE

## 2020-07-07 DIAGNOSIS — Z23 Encounter for immunization: Secondary | ICD-10-CM

## 2020-07-14 DIAGNOSIS — Z23 Encounter for immunization: Secondary | ICD-10-CM | POA: Diagnosis not present

## 2020-09-07 ENCOUNTER — Other Ambulatory Visit: Payer: Self-pay | Admitting: Nurse Practitioner

## 2020-09-07 DIAGNOSIS — I4821 Permanent atrial fibrillation: Secondary | ICD-10-CM

## 2020-11-20 ENCOUNTER — Encounter: Payer: Self-pay | Admitting: Nurse Practitioner

## 2020-11-20 ENCOUNTER — Ambulatory Visit (INDEPENDENT_AMBULATORY_CARE_PROVIDER_SITE_OTHER): Payer: MEDICARE | Admitting: Nurse Practitioner

## 2020-11-20 ENCOUNTER — Other Ambulatory Visit: Payer: Self-pay

## 2020-11-20 VITALS — BP 145/76 | HR 77 | Temp 97.8°F | Ht 60.0 in | Wt 95.4 lb

## 2020-11-20 DIAGNOSIS — E785 Hyperlipidemia, unspecified: Secondary | ICD-10-CM | POA: Diagnosis not present

## 2020-11-20 DIAGNOSIS — I10 Essential (primary) hypertension: Secondary | ICD-10-CM

## 2020-11-20 DIAGNOSIS — J410 Simple chronic bronchitis: Secondary | ICD-10-CM | POA: Diagnosis not present

## 2020-11-20 DIAGNOSIS — I4821 Permanent atrial fibrillation: Secondary | ICD-10-CM

## 2020-11-20 MED ORDER — LISINOPRIL-HYDROCHLOROTHIAZIDE 10-12.5 MG PO TABS: ORAL_TABLET | ORAL | 1 refills | Status: DC

## 2020-11-20 MED ORDER — FLUTICASONE-SALMETEROL 100-50 MCG/DOSE IN AEPB
INHALATION_SPRAY | RESPIRATORY_TRACT | 5 refills | Status: DC
Start: 2020-11-20 — End: 2021-11-20

## 2020-11-20 MED ORDER — ROSUVASTATIN CALCIUM 10 MG PO TABS: ORAL_TABLET | ORAL | 1 refills | Status: DC

## 2020-11-20 MED ORDER — METOPROLOL TARTRATE 25 MG PO TABS
25.0000 mg | ORAL_TABLET | Freq: Two times a day (BID) | ORAL | 1 refills | Status: DC
Start: 2020-11-20 — End: 2021-05-24

## 2020-11-20 NOTE — Progress Notes (Signed)
Subjective:    Patient ID: Morgan Spears, female    DOB: 03-05-1925, 87 y.o.   MRN: [id number]   Chief Complaint: Follow up for chronic disease management   HPI:  1. Primary hypertension Taking lisinopril/hctz. Doing well on this. Does not monitors BP at home. No HA, CP, Palps, SOB, or low blood pressures  BP Readings from Last 3 Encounters:  11/20/20 (!) 145/76  05/22/20 (!) 162/91  11/19/19 132/84     2. Hyperlipidemia with target LDL less than 100 Taking rosuvastatin. Tolerating well. Eats what she enjoy.  Lab Results  Component Value Date   CHOL 195 05/22/2020   HDL 61 05/22/2020   LDLCALC 121 (H) 05/22/2020   TRIG 73 05/22/2020   CHOLHDL 3.2 05/22/2020    3. Permanent atrial fibrillation (HCC) On metoprolol for rate control. Does not know when she is in AF and her HR is controlled at home. On daily 83  Mg ASA.   4. Simple chronic bronchitis (HCC) Advair daily. Doing well on this.     Outpatient Encounter Medications as of 11/20/2020  Medication Sig  . Fluticasone-Salmeterol (ADVAIR) 100-50 MCG/DOSE AEPB USE 1 INHALATION TWICE DAILY  . lisinopril-hydrochlorothiazide (ZESTORETIC) 10-12.5 MG tablet TAKE ONE (1) TABLET EACH DAY  . metoprolol tartrate (LOPRESSOR) 25 MG tablet TAKE ONE TABLET BY MOUTH TWICE DAILY  . rosuvastatin (CRESTOR) 10 MG tablet TAKE ONE (1) TABLET EACH DAY   No facility-administered encounter medications on file as of 11/20/2020.    No past surgical history on file.  Family History  Problem Relation Age of Onset  . Heart disease Mother   . Hypertension Mother   . Alcohol abuse Father   . Cancer Sister   . Emphysema Sister        smoker  . Heart disease Brother   . Alcohol abuse Brother     New complaints: Recent nose bleed  Social history: Lives alone, daughter close by.   Controlled substance contract: N/A     Review of Systems  Constitutional: Negative for activity change, fatigue and fever.  HENT: Positive for  nosebleeds.   Respiratory: Negative for cough, shortness of breath and wheezing.   Cardiovascular: Negative for chest pain and palpitations.  Gastrointestinal: Negative for constipation and diarrhea.  Genitourinary: Negative for difficulty urinating, dysuria and hematuria.  Neurological: Negative for dizziness, light-headedness and headaches.  Psychiatric/Behavioral: Negative for confusion. The patient is not nervous/anxious.        Objective:   Physical Exam HENT:     Nose: Nose normal. No congestion or rhinorrhea.  Eyes:     Pupils: Pupils are equal, round, and reactive to light.  Cardiovascular:     Rate and Rhythm: Normal rate. Rhythm irregular.     Pulses: Normal pulses.     Heart sounds: Normal heart sounds.  Pulmonary:     Effort: Pulmonary effort is normal.     Breath sounds: Normal breath sounds.  Abdominal:     General: Abdomen is flat. Bowel sounds are normal.     Palpations: Abdomen is soft.  Musculoskeletal:        General: Normal range of motion.     Cervical back: Normal range of motion and neck supple.  Skin:    General: Skin is warm and dry.     Capillary Refill: Capillary refill takes less than 2 seconds.  Neurological:     Mental Status: She is alert and oriented to person, place, and time.  Psychiatric:  Behavior: Behavior normal.    Vitals:   11/20/20 1404 11/20/20 1405  BP: (!) 153/84 (!) 145/76  Pulse: 77   Temp: 97.8 F (36.6 C)       Assessment & Plan:  Morgan Spears comes in today with chief complaint of Medical Management of Chronic Issues and Epistaxis (Last Tuesday at church took about 10 min to get it to stopped stopped baby ASA)   Diagnosis and orders addressed:  1. Primary hypertension Low sodium diet - lisinopril-hydrochlorothiazide (ZESTORETIC) 10-12.5 MG tablet; TAKE ONE (1) TABLET EACH DAY  Dispense: 90 tablet; Refill: 1  2. Hyperlipidemia with target LDL less than 100 Low fat diet - rosuvastatin (CRESTOR) 10 MG  tablet; TAKE ONE (1) TABLET EACH DAY  Dispense: 90 tablet; Refill: 1  3. Permanent atrial fibrillation (HCC) Avoid caffeine - metoprolol tartrate (LOPRESSOR) 25 MG tablet; Take 1 tablet (25 mg total) by mouth 2 (two) times daily.  Dispense: 180 tablet; Refill: 1  4. Simple chronic bronchitis (HCC) - Fluticasone-Salmeterol (ADVAIR) 100-50 MCG/DOSE AEPB; USE 1 INHALATION TWICE DAILY  Dispense: 60 each; Refill: 5   Labs pending Health Maintenance reviewed Diet and exercise encouraged  Follow up plan: 6 months   Morgan Hassell Done, FNP

## 2020-11-20 NOTE — Addendum Note (Signed)
Addended by: Chevis Pretty on: 11/20/2020 02:48 PM   Modules accepted: Orders

## 2020-11-20 NOTE — Patient Instructions (Signed)

## 2020-11-21 LAB — CBC WITH DIFFERENTIAL/PLATELET
Basophils Absolute: 0 10*3/uL (ref 0.0–0.2)
Basos: 1 %
EOS (ABSOLUTE): 0.1 10*3/uL (ref 0.0–0.4)
Eos: 2 %
Hematocrit: 42.8 % (ref 34.0–46.6)
Hemoglobin: 14.1 g/dL (ref 11.1–15.9)
Immature Grans (Abs): 0.1 10*3/uL (ref 0.0–0.1)
Immature Granulocytes: 1 %
Lymphocytes Absolute: 2.1 10*3/uL (ref 0.7–3.1)
Lymphs: 28 %
MCH: 30.5 pg (ref 26.6–33.0)
MCHC: 32.9 g/dL (ref 31.5–35.7)
MCV: 93 fL (ref 79–97)
Monocytes Absolute: 0.9 10*3/uL (ref 0.1–0.9)
Monocytes: 12 %
Neutrophils Absolute: 4.2 10*3/uL (ref 1.4–7.0)
Neutrophils: 56 %
Platelets: 177 10*3/uL (ref 150–450)
RBC: 4.62 x10E6/uL (ref 3.77–5.28)
RDW: 14.4 % (ref 11.7–15.4)
WBC: 7.3 10*3/uL (ref 3.4–10.8)

## 2020-11-21 LAB — CMP14+EGFR
ALT: 21 IU/L (ref 0–32)
AST: 39 IU/L (ref 0–40)
Albumin/Globulin Ratio: 1.9 (ref 1.2–2.2)
Albumin: 4.4 g/dL (ref 3.5–4.6)
Alkaline Phosphatase: 56 IU/L (ref 44–121)
BUN/Creatinine Ratio: 19 (ref 12–28)
BUN: 16 mg/dL (ref 10–36)
Bilirubin Total: 0.5 mg/dL (ref 0.0–1.2)
CO2: 24 mmol/L (ref 20–29)
Calcium: 9.4 mg/dL (ref 8.7–10.3)
Chloride: 97 mmol/L (ref 96–106)
Creatinine, Ser: 0.86 mg/dL (ref 0.57–1.00)
Globulin, Total: 2.3 g/dL (ref 1.5–4.5)
Glucose: 77 mg/dL (ref 65–99)
Potassium: 4.6 mmol/L (ref 3.5–5.2)
Sodium: 139 mmol/L (ref 134–144)
Total Protein: 6.7 g/dL (ref 6.0–8.5)
eGFR: 63 mL/min/{1.73_m2} (ref >59)

## 2020-11-21 LAB — LIPID PANEL
Chol/HDL Ratio: 2.7 ratio (ref 0.0–4.4)
Cholesterol, Total: 155 mg/dL (ref 100–199)
HDL: 57 mg/dL (ref >39)
LDL Chol Calc (NIH): 86 mg/dL (ref 0–99)
Triglycerides: 59 mg/dL (ref 0–149)
VLDL Cholesterol Cal: 12 mg/dL (ref 5–40)

## 2021-04-06 ENCOUNTER — Encounter: Payer: Self-pay | Admitting: Nurse Practitioner

## 2021-04-06 ENCOUNTER — Other Ambulatory Visit: Payer: Self-pay

## 2021-04-06 ENCOUNTER — Ambulatory Visit (INDEPENDENT_AMBULATORY_CARE_PROVIDER_SITE_OTHER): Payer: MEDICARE | Admitting: Nurse Practitioner

## 2021-04-06 VITALS — BP 151/78 | HR 64 | Temp 97.0°F | Resp 20 | Ht 60.0 in | Wt 85.0 lb

## 2021-04-06 DIAGNOSIS — H1033 Unspecified acute conjunctivitis, bilateral: Secondary | ICD-10-CM | POA: Diagnosis not present

## 2021-04-06 MED ORDER — POLYMYXIN B-TRIMETHOPRIM 10000-0.1 UNIT/ML-% OP SOLN: 2.0000 [drp] | OPHTHALMIC | 0 refills | Status: DC

## 2021-04-06 NOTE — Patient Instructions (Signed)
Bacterial Conjunctivitis, Adult Bacterial conjunctivitis is an infection of the clear membrane that covers the white part of your eye and the inner surface of your eyelid (conjunctiva). When the blood vessels in your conjunctiva become inflamed, your eye becomes red or pink, and it will probably feel itchy. Bacterial conjunctivitis spreads very easily from person to person (is contagious). It also spreads easily from one eye to the other eye. What are the causes? This condition is caused by bacteria. You may get the infection if you come into close contact with: A person who is infected with the bacteria. Items that are contaminated with the bacteria, such as a face towel, contact lens solution, or eye makeup. What increases the risk? You are more likely to develop this condition if you: Are exposed to other people who have the infection. Wear contact lenses. Have a sinus infection. Have had a recent eye injury or surgery. Have a weak body defense system (immune system). Have a medical condition that causes dry eyes. What are the signs or symptoms? Symptoms of this condition include: Thick, yellowish discharge from the eye. This may turn into a crust on the eyelid overnight and cause your eyelids to stick together. Tearing or watery eyes. Itchy eyes. Burning feeling in your eyes. Eye redness. Swollen eyelids. Blurred vision. How is this diagnosed? This condition is diagnosed based on your symptoms and medical history. Your health care provider may also take a sample of discharge from your eye to findthe cause of your infection. This is rarely done. How is this treated? This condition may be treated with: Antibiotic eye drops or ointment to clear the infection more quickly and prevent the spread of infection to others. Oral antibiotic medicines to treat infections that do not respond to drops or ointments or that last longer than 10 days. Cool, wet cloths (cool compresses) placed on the  eyes. Artificial tears applied 2-6 times a day. Follow these instructions at home: Medicines Take or apply your antibiotic medicine as told by your health care provider. Do not stop taking or applying the antibiotic even if you start to feel better. Take or apply over-the-counter and prescription medicines only as told by your health care provider. Be very careful to avoid touching the edge of your eyelid with the eye-drop bottle or the ointment tube when you apply medicines to the affected eye. This will keep you from spreading the infection to your other eye or to other people. Managing discomfort Gently wipe away any drainage from your eye with a warm, wet washcloth or a cotton ball. Apply a clean, cool compress to your eye for 10-20 minutes, 3-4 times a day. General instructions Do not wear contact lenses until the inflammation is gone and your health care provider says it is safe to wear them again. Ask your health care provider how to sterilize or replace your contact lenses before you use them again. Wear glasses until you can resume wearing contact lenses. Avoid wearing eye makeup until the inflammation is gone. Throw away any old eye cosmetics that may be contaminated. Change or wash your pillowcase every day. Do not share towels or washcloths. This may spread the infection. Wash your hands often with soap and water. Use paper towels to dry your hands. Avoid touching or rubbing your eyes. Do not drive or use heavy machinery if your vision is blurred. Contact a health care provider if: You have a fever. Your symptoms do not get better after 10 days. Get help right  away if you have: A fever and your symptoms suddenly get worse. Severe pain when you move your eye. Facial pain, redness, or swelling. Sudden loss of vision. Summary Bacterial conjunctivitis is an infection of the clear membrane that covers the white part of your eye and the inner surface of your eyelid  (conjunctiva). Bacterial conjunctivitis spreads very easily from person to person (is contagious). Wash your hands often with soap and water. Use paper towels to dry your hands. Take or apply your antibiotic medicine as told by your health care provider. Do not stop taking or applying the antibiotic even if you start to feel better. Contact a health care provider if you have a fever or your symptoms do not get better after 10 days. This information is not intended to replace advice given to you by your health care provider. Make sure you discuss any questions you have with your healthcare provider. Document Revised: 12/01/2018 Document Reviewed: 03/18/2018 Elsevier Patient Education  Morgan Spears.

## 2021-04-06 NOTE — Progress Notes (Signed)
   Subjective:    Patient ID: Morgan Spears, female    DOB: 1925-07-04, 87 y.o.   MRN: [id number]   Chief Complaint: Bilateral eye redness   HPI Patient developed red eyes with drainage on Wednesday. Her eyelashes have been matted together. They had some eye drops prescribed on the past but they have been out of date.      Review of Systems  Constitutional: Negative.   HENT: Negative.    Eyes:  Positive for discharge, redness and itching. Negative for photophobia and visual disturbance.  All other systems reviewed and are negative.     Objective:   Physical Exam Vitals and nursing note reviewed.  Constitutional:      Appearance: Normal appearance.  Eyes:     General:        Right eye: Discharge present.        Left eye: Discharge present.    Pupils: Pupils are equal, round, and reactive to light.     Comments: Scleral injection bil.  Cardiovascular:     Rate and Rhythm: Normal rate and regular rhythm.     Heart sounds: Normal heart sounds.  Pulmonary:     Effort: Pulmonary effort is normal.     Breath sounds: Normal breath sounds.  Neurological:     Mental Status: She is alert.    BP (!) 151/78   Pulse 64   Temp (!) 97 F (36.1 C) (Temporal)   Resp 20   Ht 5' (1.524 m)   Wt 85 lb (38.6 kg)   BMI 16.60 kg/m        Assessment & Plan:   Morgan Spears in today with chief complaint of Bilateral eye redness   1. Acute bacterial conjunctivitis of both eyes Meds ordered this encounter  Medications   trimethoprim-polymyxin b (POLYTRIM) ophthalmic solution    Sig: Place 2 drops into both eyes every 4 (four) hours.    Dispense:  10 mL    Refill:  0    Order Specific Question:   Supervising Provider    Answer:   Worthy Rancher [id number]   Cool compresses Good handwashing Avoid rubbing eyes  Mary-Margaret Hassell Done, FNP     The above assessment and management plan was discussed with the patient. The patient verbalized understanding of and has  agreed to the management plan. Patient is aware to call the clinic if symptoms persist or worsen. Patient is aware when to return to the clinic for a follow-up visit. Patient educated on when it is appropriate to go to the emergency department.   Mary-Margaret Hassell Done, FNP

## 2021-04-23 ENCOUNTER — Other Ambulatory Visit: Payer: Self-pay | Admitting: Nurse Practitioner

## 2021-04-23 DIAGNOSIS — I10 Essential (primary) hypertension: Secondary | ICD-10-CM

## 2021-05-24 ENCOUNTER — Encounter: Payer: Self-pay | Admitting: Nurse Practitioner

## 2021-05-24 ENCOUNTER — Other Ambulatory Visit: Payer: Self-pay

## 2021-05-24 ENCOUNTER — Ambulatory Visit (INDEPENDENT_AMBULATORY_CARE_PROVIDER_SITE_OTHER): Payer: MEDICARE | Admitting: Nurse Practitioner

## 2021-05-24 VITALS — BP 141/75 | HR 79 | Temp 97.3°F | Resp 20 | Ht 60.0 in | Wt 87.0 lb

## 2021-05-24 DIAGNOSIS — J410 Simple chronic bronchitis: Secondary | ICD-10-CM

## 2021-05-24 DIAGNOSIS — I10 Essential (primary) hypertension: Secondary | ICD-10-CM

## 2021-05-24 DIAGNOSIS — Z23 Encounter for immunization: Secondary | ICD-10-CM

## 2021-05-24 DIAGNOSIS — M8588 Other specified disorders of bone density and structure, other site: Secondary | ICD-10-CM | POA: Diagnosis not present

## 2021-05-24 DIAGNOSIS — E785 Hyperlipidemia, unspecified: Secondary | ICD-10-CM | POA: Diagnosis not present

## 2021-05-24 DIAGNOSIS — I4821 Permanent atrial fibrillation: Secondary | ICD-10-CM

## 2021-05-24 MED ORDER — LISINOPRIL-HYDROCHLOROTHIAZIDE 10-12.5 MG PO TABS
1.0000 | ORAL_TABLET | Freq: Every day | ORAL | 1 refills | Status: DC
Start: 2021-05-24 — End: 2021-10-19

## 2021-05-24 MED ORDER — ROSUVASTATIN CALCIUM 10 MG PO TABS: ORAL_TABLET | ORAL | 1 refills | Status: DC

## 2021-05-24 MED ORDER — METOPROLOL TARTRATE 25 MG PO TABS: 25.0000 mg | ORAL_TABLET | Freq: Two times a day (BID) | ORAL | 1 refills | Status: DC

## 2021-05-24 NOTE — Patient Instructions (Signed)
Influenza (Flu) Vaccine (Inactivated or Recombinant): What You Need to Know 1. Why get vaccinated? Influenza vaccine can prevent influenza (flu). Flu is a contagious disease that spreads around the [place] every year, usually between October and May. Anyone can get the flu, but it is more dangerous for some people. Infants and young children, people 65 years and older, pregnant people, and people with certain health conditions or a weakened immune system are at greatest risk of flu complications. Pneumonia, bronchitis, sinus infections, and ear infections are examples of flu-related complications. If you have a medical condition, such as heart disease, cancer, or diabetes, flu can make it worse. Flu can cause fever and chills, sore throat, muscle aches, fatigue, cough, headache, and runny or stuffy nose. Some people may have vomiting and diarrhea, though this is more common in children than adults. In an average year, thousands of people in the [place] die from flu, and many more are hospitalized. Flu vaccine prevents millions of illnesses and flu-related visits to the doctor each year. 2. Influenza vaccines CDC recommends everyone 6 months and older get vaccinated every flu season. Children 6 months through 8 years of age may need 2 doses during a single flu season. Everyone else needs only 1 dose each flu season. It takes about 2 weeks for protection to develop after vaccination. There are many flu viruses, and they are always changing. Each year a new flu vaccine is made to protect against the influenza viruses believed to be likely to cause disease in the upcoming flu season. Even when the vaccine doesn't exactly match these viruses, it may still provide some protection. Influenza vaccine does not cause flu. Influenza vaccine may be given at the same time as other vaccines. 3. Talk with your health care provider Tell your vaccination provider if the person getting the vaccine: Has had  an allergic reaction after a previous dose of influenza vaccine, or has any severe, life-threatening allergies Has ever had Guillain-Barr Syndrome (also called "GBS") In some cases, your health care provider may decide to postpone influenza vaccination until a future visit. Influenza vaccine can be administered at any time during pregnancy. People who are or will be pregnant during influenza season should receive inactivated influenza vaccine. People with minor illnesses, such as a cold, may be vaccinated. People who are moderately or severely ill should usually wait until they recover before getting influenza vaccine. Your health care provider can give you more information. 4. Risks of a vaccine reaction Soreness, redness, and swelling where the shot is given, fever, muscle aches, and headache can happen after influenza vaccination. There may be a very small increased risk of Guillain-Barr Syndrome (GBS) after inactivated influenza vaccine (the flu shot). Young children who get the flu shot along with pneumococcal vaccine (PCV13) and/or DTaP vaccine at the same time might be slightly more likely to have a seizure caused by fever. Tell your health care provider if a child who is getting flu vaccine has ever had a seizure. People sometimes faint after medical procedures, including vaccination. Tell your provider if you feel dizzy or have vision changes or ringing in the ears. As with any medicine, there is a very remote chance of a vaccine causing a severe allergic reaction, other serious injury, or death. 5. What if there is a serious problem? An allergic reaction could occur after the vaccinated person leaves the clinic. If you see signs of a severe allergic reaction (hives, swelling of the face and throat, difficulty breathing,   a fast heartbeat, dizziness, or weakness), call 9-1-1 and get the person to the nearest hospital. For other signs that concern you, call your health care provider. Adverse  reactions should be reported to the Vaccine Adverse Event Reporting System (VAERS). Your health care provider will usually file this report, or you can do it yourself. Visit the VAERS website at www.vaers.hhs.gov or call 1-800-822-7967. VAERS is only for reporting reactions, and VAERS staff members do not give medical advice. 6. The National Vaccine Injury Compensation Program The National Vaccine Injury Compensation Program (VICP) is a federal program that was created to compensate people who may have been injured by certain vaccines. Claims regarding alleged injury or death due to vaccination have a time limit for filing, which may be as short as two years. Visit the VICP website at www.hrsa.gov/vaccinecompensation or call 1-800-338-2382 to learn about the program and about filing a claim. 7. How can I learn more? Ask your health care provider. Call your local or state health department. Visit the website of the Food and Drug Administration (FDA) for vaccine package inserts and additional information at www.fda.gov/vaccines-blood-biologics/vaccines. Contact the Centers for Disease Control and Prevention (CDC): Call 1-800-232-4636 (1-800-CDC-INFO) or Visit CDC's website at www.cdc.gov/flu. Vaccine Information Statement Inactivated Influenza Vaccine (03/31/2020) This information is not intended to replace advice given to you by your health care provider. Make sure you discuss any questions you have with your health care provider. Document Revised: 05/18/2020 Document Reviewed: 05/18/2020 Elsevier Patient Education  2022 [employer]  

## 2021-05-24 NOTE — Progress Notes (Signed)
Subjective:    Patient ID: Morgan Spears, female    DOB: 05-26-1925, 87 y.o.   MRN: [id number]   Chief Complaint: medical management of chronic issues     HPI:  1. Essential hypertension No c/o chest pain, sob or headache. Does not check blood pressure at home. BP Readings from Last 3 Encounters:  05/24/21 (!) 141/75  04/06/21 (!) 151/78  11/20/20 (!) 145/76      2. Permanent atrial fibrillation (HCC) No recent palpitations or heart racing. Metoprolol keeps heart rate under control.   3. Simple chronic bronchitis (HCC) Occasional cough, but overall doing well. Uses advair daily.  4. Hyperlipidemia with target LDL less than 100 Does not really watch diet. Does very little if any exercise. Lab Results  Component Value Date   CHOL 155 11/20/2020   HDL 57 11/20/2020   LDLCALC 86 11/20/2020   TRIG 59 11/20/2020   CHOLHDL 2.7 11/20/2020     5. Osteopenia of lumbar spine last dexascan was done in 2015. Patient does not want to do anymore.    Outpatient Encounter Medications as of 05/24/2021  Medication Sig   aspirin EC 81 MG tablet Take 81 mg by mouth daily. Swallow whole.   Fluticasone-Salmeterol (ADVAIR) 100-50 MCG/DOSE AEPB USE 1 INHALATION TWICE DAILY   lisinopril-hydrochlorothiazide (ZESTORETIC) 10-12.5 MG tablet TAKE ONE (1) TABLET EACH DAY   metoprolol tartrate (LOPRESSOR) 25 MG tablet Take 1 tablet (25 mg total) by mouth 2 (two) times daily.   rosuvastatin (CRESTOR) 10 MG tablet TAKE ONE (1) TABLET EACH DAY   trimethoprim-polymyxin b (POLYTRIM) ophthalmic solution Place 2 drops into both eyes every 4 (four) hours.   No facility-administered encounter medications on file as of 05/24/2021.    History reviewed. No pertinent surgical history.  Family History  Problem Relation Age of Onset   Heart disease Mother    Hypertension Mother    Alcohol abuse Father    Cancer Sister    Emphysema Sister        smoker   Heart disease Brother    Alcohol abuse  Brother     New complaints: None today  Social history: Lives by herself and her daughter check so her daily.  Controlled substance contract: n/a     Review of Systems  Constitutional:  Negative for diaphoresis.  Eyes:  Negative for pain.  Respiratory:  Negative for shortness of breath.   Cardiovascular:  Negative for chest pain, palpitations and leg swelling.  Gastrointestinal:  Negative for abdominal pain.  Endocrine: Negative for polydipsia.  Skin:  Negative for rash.  Neurological:  Negative for dizziness, weakness and headaches.  Hematological:  Does not bruise/bleed easily.  All other systems reviewed and are negative.     Objective:   Physical Exam Vitals and nursing note reviewed.  Constitutional:      General: She is not in acute distress.    Appearance: Normal appearance. She is well-developed.  HENT:     Head: Normocephalic.     Right Ear: Tympanic membrane normal.     Left Ear: Tympanic membrane normal.     Nose: Nose normal.     Mouth/Throat:     Mouth: Mucous membranes are moist.  Eyes:     Pupils: Pupils are equal, round, and reactive to light.  Neck:     Vascular: No carotid bruit or JVD.  Cardiovascular:     Rate and Rhythm: Normal rate. Rhythm irregular.     Heart sounds: Normal heart sounds.  Pulmonary:     Effort: Pulmonary effort is normal. No respiratory distress.     Breath sounds: Normal breath sounds. No wheezing or rales.  Chest:     Chest wall: No tenderness.  Abdominal:     General: Bowel sounds are normal. There is no distension or abdominal bruit.     Palpations: Abdomen is soft. There is no hepatomegaly, splenomegaly, mass or pulsatile mass.     Tenderness: There is no abdominal tenderness.  Musculoskeletal:        General: Normal range of motion.     Cervical back: Normal range of motion and neck supple.  Lymphadenopathy:     Cervical: No cervical adenopathy.  Skin:    General: Skin is warm and dry.  Neurological:      Mental Status: She is alert and oriented to person, place, and time.     Deep Tendon Reflexes: Reflexes are normal and symmetric.  Psychiatric:        Behavior: Behavior normal.        Thought Content: Thought content normal.        Judgment: Judgment normal.    BP (!) 141/75   Pulse 79   Temp (!) 97.3 F (36.3 C) (Temporal)   Resp 20   Ht 5' (1.524 m)   Wt 87 lb (39.5 kg)   SpO2 98%   BMI 16.99 kg/m        Assessment & Plan:   Morgan Spears comes in today with chief complaint of Medical Management of Chronic Issues (Crusty area to right ear)   Diagnosis and orders addressed:  1. Primary hypertension Low sodium diet - lisinopril-hydrochlorothiazide (ZESTORETIC) 10-12.5 MG tablet; Take 1 tablet by mouth daily.  Dispense: 90 tablet; Refill: 1 - CBC with Differential/Platelet - CMP14+EGFR  2. Permanent atrial fibrillation (HCC) Avoid caffeine - metoprolol tartrate (LOPRESSOR) 25 MG tablet; Take 1 tablet (25 mg total) by mouth 2 (two) times daily.  Dispense: 180 tablet; Refill: 1  3. Simple chronic bronchitis (HCC) Report any cough or SOB  4. Hyperlipidemia with target LDL less than 100 Low fat diet - rosuvastatin (CRESTOR) 10 MG tablet; TAKE ONE (1) TABLET EACH DAY  Dispense: 90 tablet; Refill: 1 - Lipid panel  5. Osteopenia of lumbar spine Weight bearing exercise    Labs pending Health Maintenance reviewed Diet and exercise encouraged  Follow up plan: 6 months   [place], FNP

## 2021-05-25 LAB — CBC WITH DIFFERENTIAL/PLATELET
Basophils Absolute: 0 10*3/uL (ref 0.0–0.2)
Basos: 1 %
EOS (ABSOLUTE): 0.1 10*3/uL (ref 0.0–0.4)
Eos: 1 %
Hematocrit: 42.8 % (ref 34.0–46.6)
Hemoglobin: 14.6 g/dL (ref 11.1–15.9)
Immature Grans (Abs): 0.1 10*3/uL (ref 0.0–0.1)
Immature Granulocytes: 1 %
Lymphocytes Absolute: 2.1 10*3/uL (ref 0.7–3.1)
Lymphs: 27 %
MCH: 31.5 pg (ref 26.6–33.0)
MCHC: 34.1 g/dL (ref 31.5–35.7)
MCV: 92 fL (ref 79–97)
Monocytes Absolute: 0.8 10*3/uL (ref 0.1–0.9)
Monocytes: 10 %
Neutrophils Absolute: 4.7 10*3/uL (ref 1.4–7.0)
Neutrophils: 60 %
Platelets: 162 10*3/uL (ref 150–450)
RBC: 4.64 x10E6/uL (ref 3.77–5.28)
RDW: 14.9 % (ref 11.7–15.4)
WBC: 7.8 10*3/uL (ref 3.4–10.8)

## 2021-05-25 LAB — CMP14+EGFR
ALT: 24 IU/L (ref 0–32)
AST: 37 IU/L (ref 0–40)
Albumin/Globulin Ratio: 1.7 (ref 1.2–2.2)
Albumin: 4.5 g/dL (ref 3.5–4.6)
Alkaline Phosphatase: 51 IU/L (ref 44–121)
BUN/Creatinine Ratio: 30 — ABNORMAL HIGH (ref 12–28)
BUN: 30 mg/dL (ref 10–36)
Bilirubin Total: 0.6 mg/dL (ref 0.0–1.2)
CO2: 25 mmol/L (ref 20–29)
Calcium: 9.8 mg/dL (ref 8.7–10.3)
Chloride: 94 mmol/L — ABNORMAL LOW (ref 96–106)
Creatinine, Ser: 1 mg/dL (ref 0.57–1.00)
Globulin, Total: 2.6 g/dL (ref 1.5–4.5)
Glucose: 94 mg/dL (ref 70–99)
Potassium: 4.4 mmol/L (ref 3.5–5.2)
Sodium: 137 mmol/L (ref 134–144)
Total Protein: 7.1 g/dL (ref 6.0–8.5)
eGFR: 53 mL/min/{1.73_m2} — ABNORMAL LOW (ref >59)

## 2021-05-25 LAB — LIPID PANEL
Chol/HDL Ratio: 3 ratio (ref 0.0–4.4)
Cholesterol, Total: 149 mg/dL (ref 100–199)
HDL: 49 mg/dL (ref >39)
LDL Chol Calc (NIH): 83 mg/dL (ref 0–99)
Triglycerides: 93 mg/dL (ref 0–149)
VLDL Cholesterol Cal: 17 mg/dL (ref 5–40)

## 2021-06-19 ENCOUNTER — Other Ambulatory Visit: Payer: Self-pay

## 2021-06-19 ENCOUNTER — Ambulatory Visit (INDEPENDENT_AMBULATORY_CARE_PROVIDER_SITE_OTHER): Payer: MEDICARE | Admitting: Nurse Practitioner

## 2021-06-19 ENCOUNTER — Encounter: Payer: Self-pay | Admitting: Nurse Practitioner

## 2021-06-19 VITALS — BP 144/75 | HR 102 | Temp 97.4°F | Resp 20 | Ht 60.0 in | Wt 87.0 lb

## 2021-06-19 DIAGNOSIS — S80822A Blister (nonthermal), left lower leg, initial encounter: Secondary | ICD-10-CM

## 2021-06-19 MED ORDER — SULFAMETHOXAZOLE-TRIMETHOPRIM 800-160 MG PO TABS: 1.0000 | ORAL_TABLET | Freq: Two times a day (BID) | ORAL | 0 refills | Status: DC

## 2021-06-19 NOTE — Progress Notes (Signed)
Subjective:    Patient ID: Morgan Spears, female    DOB: August 25, 1925, 87 y.o.   MRN: [id number]   Chief Complaint: Spot on left lower leg   HPI Patient has a blood blister on her left lower leg. Not sure what she hit her leg on.    Review of Systems  Constitutional:  Negative for diaphoresis.  Eyes:  Negative for pain.  Respiratory:  Negative for shortness of breath.   Cardiovascular:  Negative for chest pain, palpitations and leg swelling.  Gastrointestinal:  Negative for abdominal pain.  Endocrine: Negative for polydipsia.  Skin:  Negative for rash.  Neurological:  Negative for dizziness, weakness and headaches.  Hematological:  Does not bruise/bleed easily.  All other systems reviewed and are negative.     Objective:   Physical Exam Vitals and nursing note reviewed.  Constitutional:      General: She is not in acute distress.    Appearance: Normal appearance. She is well-developed.  Neck:     Vascular: No carotid bruit or JVD.  Cardiovascular:     Rate and Rhythm: Normal rate and regular rhythm.     Heart sounds: Normal heart sounds.  Pulmonary:     Effort: Pulmonary effort is normal. No respiratory distress.     Breath sounds: Normal breath sounds. No wheezing or rales.  Chest:     Chest wall: No tenderness.  Abdominal:     General: Bowel sounds are normal. There is no distension or abdominal bruit.     Palpations: Abdomen is soft. There is no hepatomegaly, splenomegaly, mass or pulsatile mass.     Tenderness: There is no abdominal tenderness.  Musculoskeletal:        General: Normal range of motion.     Cervical back: Normal range of motion and neck supple.  Lymphadenopathy:     Cervical: No cervical adenopathy.  Skin:    General: Skin is warm and dry.     Comments: 7cm annular black blood blister on left lower leg  Neurological:     Mental Status: She is alert and oriented to person, place, and time.     Deep Tendon Reflexes: Reflexes are normal and  symmetric.  Psychiatric:        Behavior: Behavior normal.        Thought Content: Thought content normal.        Judgment: Judgment normal.    BP (!) 144/75   Pulse (!) 102   Temp (!) 97.4 F (36.3 C) (Temporal)   Resp 20   Ht 5' (1.524 m)   Wt 87 lb (39.5 kg)   SpO2 96%   BMI 16.99 kg/m   Wound debridement of left lower leg      Assessment & Plan:   Morgan Spears in today with chief complaint of Spot on left lower leg   1. Blister of left lower leg, initial encounter Clean with antibacterail soap BID Triple antibiotic ointment- no neosporin Keep clean and dry Meds ordered this encounter  Medications   sulfamethoxazole-trimethoprim (BACTRIM DS) 800-160 MG tablet    Sig: Take 1 tablet by mouth 2 (two) times daily.    Dispense:  20 tablet    Refill:  0    Order Specific Question:   Supervising Provider    Answer:   Caryl Pina A [[id number]]       The above assessment and management plan was discussed with the patient. The patient verbalized understanding of and has agreed to the  management plan. Patient is aware to call the clinic if symptoms persist or worsen. Patient is aware when to return to the clinic for a follow-up visit. Patient educated on when it is appropriate to go to the emergency department.   Mary-Margaret Hassell Done, FNP

## 2021-06-26 ENCOUNTER — Ambulatory Visit (INDEPENDENT_AMBULATORY_CARE_PROVIDER_SITE_OTHER): Payer: MEDICARE | Admitting: Nurse Practitioner

## 2021-06-26 ENCOUNTER — Other Ambulatory Visit: Payer: Self-pay

## 2021-06-26 ENCOUNTER — Encounter: Payer: Self-pay | Admitting: Nurse Practitioner

## 2021-06-26 VITALS — BP 131/86 | HR 82 | Temp 98.0°F | Resp 20 | Ht 60.0 in | Wt 85.0 lb

## 2021-06-26 DIAGNOSIS — L209 Atopic dermatitis, unspecified: Secondary | ICD-10-CM | POA: Diagnosis not present

## 2021-06-26 NOTE — Progress Notes (Signed)
   Subjective:    Patient ID: Morgan Spears, female    DOB: 11-Jul-1925, 87 y.o.   MRN: [id number]  Chief Complaint: Rash to left side of face   HPI Rash on left side of face. Started while taking antibiotic. Slightly tender to touch. No drainage    Review of Systems  Constitutional:  Negative for diaphoresis.  Eyes:  Negative for pain.  Respiratory:  Negative for shortness of breath.   Cardiovascular:  Negative for chest pain, palpitations and leg swelling.  Gastrointestinal:  Negative for abdominal pain.  Endocrine: Negative for polydipsia.  Skin:  Negative for rash.  Neurological:  Negative for dizziness, weakness and headaches.  Hematological:  Does not bruise/bleed easily.  All other systems reviewed and are negative.     Objective:   Physical Exam Constitutional:      Appearance: Normal appearance.  Cardiovascular:     Rate and Rhythm: Normal rate and regular rhythm.  Pulmonary:     Breath sounds: Normal breath sounds.  Skin:    General: Skin is warm.     Comments: Erythematous slightly tender macular rsa h on left side of face.  Neurological:     General: No focal deficit present.     Mental Status: She is alert and oriented to person, place, and time.  Psychiatric:        Mood and Affect: Mood normal.        Behavior: Behavior normal.    BP 131/86   Pulse 82   Temp 98 F (36.7 C) (Temporal)   Resp 20   Ht 5' (1.524 m)   Wt 85 lb (38.6 kg)   BMI 16.60 kg/m          Assessment & Plan:   Morgan Spears in today with chief complaint of Rash to left side of face   1. Atopic dermatitis, unspecified type OTC steroid cream If not improving let me know'    The above assessment and management plan was discussed with the patient. The patient verbalized understanding of and has agreed to the management plan. Patient is aware to call the clinic if symptoms persist or worsen. Patient is aware when to return to the clinic for a follow-up visit.  Patient educated on when it is appropriate to go to the emergency department.   Mary-Margaret Hassell Done, FNP

## 2021-10-19 ENCOUNTER — Other Ambulatory Visit: Payer: Self-pay | Admitting: Nurse Practitioner

## 2021-10-19 DIAGNOSIS — I10 Essential (primary) hypertension: Secondary | ICD-10-CM

## 2021-11-08 ENCOUNTER — Encounter: Payer: Self-pay | Admitting: Family

## 2021-11-08 ENCOUNTER — Ambulatory Visit (INDEPENDENT_AMBULATORY_CARE_PROVIDER_SITE_OTHER): Payer: MEDICARE | Admitting: Family

## 2021-11-08 VITALS — BP 145/80 | HR 88 | Temp 97.3°F | Ht 60.0 in | Wt 94.4 lb

## 2021-11-08 DIAGNOSIS — S8012XA Contusion of left lower leg, initial encounter: Secondary | ICD-10-CM

## 2021-11-08 DIAGNOSIS — I4821 Permanent atrial fibrillation: Secondary | ICD-10-CM | POA: Diagnosis not present

## 2021-11-08 DIAGNOSIS — I739 Peripheral vascular disease, unspecified: Secondary | ICD-10-CM

## 2021-11-08 NOTE — Patient Instructions (Signed)
Hematoma ?A hematoma is a collection of blood under the skin, in an organ, in a body space, in a joint space, or in other tissue. The blood can thicken (clot) to form a lump that you can see and feel. The lump is often firm and may become sore and tender. Most hematomas get better in a few days to weeks. However, some hematomas may be serious and require medical care. Hematomas can range from very small to very large. ?What are the causes? ?This condition is caused by: ?A blunt or penetrating injury. ?A leakage from a blood vessel under the skin. ?Some medical procedures, including surgeries, such as oral surgery, face lifts, and surgeries on the joints. ?Some medical conditions that cause bleeding or bruising. There may be multiple hematomas that appear in different areas of the body. ?What increases the risk? ?You are more likely to develop this condition if: ?You are an older adult. ?You use blood thinners. ?What are the signs or symptoms? ?Symptoms of this condition depend on where the hematoma is located.  ?Common symptoms of a hematoma that is under the skin include: ?A firm lump on the body. ?Pain and tenderness in the area. ?Bruising. Blue, dark blue, purple-red, or yellowish skin (discoloration) may appear at the site of the hematoma if the hematoma is close to the surface of the skin. ?Common symptoms of a hematoma that is deep in the tissues or body spaces may be less obvious. They include: ?A collection of blood in the stomach (intra-abdominal hematoma). This may cause pain in the abdomen, weakness, fainting, and shortness of breath. ?A collection of blood in the head (intracranial hematoma). This may cause a headache or symptoms such as weakness, trouble speaking or understanding, or a change in consciousness.  ?How is this diagnosed? ?This condition is diagnosed based on: ?Your medical history. ?A physical exam. ?Imaging tests, such as an ultrasound or CT scan. These may be needed if your health care  provider suspects a hematoma in deeper tissues or body spaces. ?Blood tests. These may be needed if your health care provider believes that the hematoma is caused by a medical condition. ?How is this treated? ?Treatment for this condition depends on the cause, size, and location of the hematoma. Treatment may include: ?Doing nothing. The majority of hematomas do not need treatment as many of them go away on their own over time. ?Surgery or close monitoring. This may be needed for large hematomas or hematomas that affect vital organs. ?Medicines. Medicines may be given if there is an underlying medical cause for the hematoma. ?Follow these instructions at home: ?Managing pain, stiffness, and swelling ? ?If directed, put ice on the affected area. ?Put ice in a plastic bag. ?Place a towel between your skin and the bag. ?Leave the ice on for 20 minutes, 2-3 times a day for the first couple of days. ?If directed, apply heat to the affected area after applying ice for a couple of days. Use the heat source that your health care provider recommends, such as a moist heat pack or a heating pad. ?Place a towel between your skin and the heat source. ?Leave the heat on for 20-30 minutes. ?Remove the heat if your skin turns bright red. This is especially important if you are unable to feel pain, heat, or cold. You may have a greater risk of getting burned. ?Raise (elevate) the affected area above the level of your heart while you are sitting or lying down. ?If told, wrap the  affected area with an elastic bandage. The bandage applies pressure (compression) to the area, which may help to reduce swelling and promote healing. Do not wrap the bandage too tightly around the affected area. ?If your hematoma is on a leg or foot (lower extremity) and is painful, your health care provider may recommend crutches. Use them as told by your health care provider. ?General instructions ?Take over-the-counter and prescription medicines only as  told by your health care provider. ?Keep all follow-up visits as told by your health care provider. This is important. ?Contact a health care provider if: ?You have a fever. ?The swelling or discoloration gets worse. ?You develop more hematomas. ?Get help right away if: ?Your pain is worse or your pain is not controlled with medicine. ?Your skin over the hematoma breaks or starts bleeding. ?Your hematoma is in your chest or abdomen and you have weakness, shortness of breath, or a change in consciousness. ?You have a hematoma on your scalp that is caused by a fall or injury, and you also have: ?A headache that gets worse. ?Trouble speaking or understanding speech. ?Weakness. ?Change in alertness or consciousness. ?Summary ?A hematoma is a collection of blood under the skin, in an organ, in a body space, in a joint space, or in other tissue. ?This condition usually does not need treatment because many hematomas go away on their own over time. ?Large hematomas, or those that may affect vital organs, may need surgical drainage or monitoring. If the hematoma is caused by a medical condition, medicines may be prescribed. ?Get help right away if your hematoma breaks or starts to bleed, you have shortness of breath, or you have a headache or trouble speaking after a fall. ?This information is not intended to replace advice given to you by your health care provider. Make sure you discuss any questions you have with your health care provider. ?Document Revised: 01/06/2019 Document Reviewed: 01/15/2018 ?Elsevier Patient Education ? [place]. ? ?

## 2021-11-08 NOTE — Progress Notes (Signed)
? ?  Subjective:  ? ? Patient ID: Morgan Spears, female    DOB: 02/07/1925, 88 y.o.   MRN: [id number] ? ?Chief Complaint  ?Patient presents with  ? Leg Injury  ?  Left leg   ? ? ?HPI ?PT presents to the office today with leg ecchymosis. Daughter states she checked on her mother yesterday and had bruising. She has PAD and chronic discoloration. The patient states she hit her calf on something. She can not remember what she hit. Denies any pain or bleeding at this time.  ? ?She does currently take aspirin of A Fib.  ? ?Review of Systems  ?All other systems reviewed and are negative. ? ?   ?Objective:  ? Physical Exam ?Vitals reviewed.  ?Constitutional:   ?   General: She is not in acute distress. ?   Appearance: She is well-developed.  ?Eyes:  ?   Pupils: Pupils are equal, round, and reactive to light.  ?Neck:  ?   Thyroid: No thyromegaly.  ?Cardiovascular:  ?   Rate and Rhythm: Normal rate and regular rhythm.  ?   Heart sounds: Normal heart sounds. No murmur heard. ?Pulmonary:  ?   Effort: Pulmonary effort is normal. No respiratory distress.  ?   Breath sounds: Normal breath sounds. No wheezing.  ?Abdominal:  ?   General: Bowel sounds are normal. There is no distension.  ?   Palpations: Abdomen is soft.  ?   Tenderness: There is no abdominal tenderness.  ?Musculoskeletal:     ?   General: No tenderness.  ?   Cervical back: Normal range of motion and neck supple.  ?   Comments: Pulses bilateral, chronic discoloration of bilateral legs. Large hematoma present.   ?Skin: ?   General: Skin is warm and dry.  ?   Coloration: Skin is not jaundiced.  ?   Findings: Bruising present.  ?Neurological:  ?   Mental Status: She is alert and oriented to person, place, and time.  ?   Cranial Nerves: No cranial nerve deficit.  ?   Deep Tendon Reflexes: Reflexes are normal and symmetric.  ?Psychiatric:     ?   Behavior: Behavior normal.     ?   Thought Content: Thought content normal.     ?   Judgment: Judgment normal.   ? ? ? ? ? ? ? ? ?   ?Assessment & Plan:  ?1. Hematoma of left lower extremity, initial encounter ?Keep elevated  ?Do not pick at area ?Discoloration may get worse as blood is absorbed  ? ?2. PAD (peripheral artery disease) ([place]) ? ?3. Permanent atrial fibrillation ([place]) ?Daughter asks about stopping aspirin. Given age I said ok to hold for now. She is seeing her PCP in 2 weeks and can discuss. Given age and risks.  ? ? ?Evelina Dun, FNP ? ? ? ?

## 2021-11-14 ENCOUNTER — Other Ambulatory Visit: Payer: Self-pay

## 2021-11-14 ENCOUNTER — Encounter: Payer: Self-pay | Admitting: Nurse Practitioner

## 2021-11-14 ENCOUNTER — Ambulatory Visit (INDEPENDENT_AMBULATORY_CARE_PROVIDER_SITE_OTHER): Payer: MEDICARE | Admitting: Nurse Practitioner

## 2021-11-14 ENCOUNTER — Ambulatory Visit (HOSPITAL_COMMUNITY)
Admission: RE | Admit: 2021-11-14 | Discharge: 2021-11-14 | Disposition: A | Discharge status: Home / Self Care | Payer: MEDICARE | Source: Ambulatory Visit | Attending: Nurse Practitioner | Admitting: Nurse Practitioner

## 2021-11-14 VITALS — BP 158/82 | HR 88 | Temp 98.0°F | Ht 60.0 in | Wt 94.0 lb

## 2021-11-14 DIAGNOSIS — S0990XA Unspecified injury of head, initial encounter: Secondary | ICD-10-CM | POA: Insufficient documentation

## 2021-11-14 DIAGNOSIS — W19XXXA Unspecified fall, initial encounter: Secondary | ICD-10-CM | POA: Diagnosis not present

## 2021-11-14 DIAGNOSIS — T148XXA Other injury of unspecified body region, initial encounter: Secondary | ICD-10-CM

## 2021-11-14 DIAGNOSIS — S0003XA Contusion of scalp, initial encounter: Secondary | ICD-10-CM | POA: Diagnosis not present

## 2021-11-14 NOTE — Progress Notes (Signed)
? ?Acute Office Visit ? ?Subjective:  ? ? Patient ID: Morgan Spears, female    DOB: [birth year], 88 y.o.   MRN: [id number] ? ?Chief Complaint  ?Patient presents with  ? Fall  ? Head Injury  ? ? ?Fall ?The accident occurred 3 to 6 hours ago. The fall occurred while walking. She fell from an unknown height. She landed on [place]. There was no blood loss. The point of impact was the head. The pain is present in the head. The pain is at a severity of 4/10. The pain is mild. The symptoms are aggravated by pressure on injury. Associated symptoms include headaches. Pertinent negatives include no abdominal pain, hearing loss, numbness, tingling or visual change. She has tried nothing for the symptoms.  ?Head Injury  ?The incident occurred 1 to 3 hours ago. The injury mechanism was a fall. There was no blood loss. The quality of the pain is described as aching. The pain is at a severity of 4/10. The pain is mild. The pain has been intermittent since the injury. Associated symptoms include headaches. Pertinent negatives include no blurred vision, memory loss or numbness. She has tried nothing for the symptoms.  ? ? ?Past Medical History:  ?Diagnosis Date  ? Cataract   ? COPD (chronic obstructive pulmonary disease) ([place])   ? Diffuse cystic mastopathy   ? Frequency of urination and polyuria   ? Hypertension   ? Osteopenia   ? Other and unspecified hyperlipidemia   ? Postmenopausal atrophic vaginitis   ? ? ?History reviewed. No pertinent surgical history. ? ?Family History  ?Problem Relation Age of Onset  ? Heart disease Mother   ? Hypertension Mother   ? Alcohol abuse Father   ? Cancer Sister   ? Emphysema Sister   ?     smoker  ? Heart disease Brother   ? Alcohol abuse Brother   ? ? ?Social History  ? ?Socioeconomic History  ? Marital status: Widowed  ?  Spouse name: Not on file  ? Number of children: 2  ? Years of education: 12th  ? Highest education level: 12th grade  ?Occupational History  ? Not on file  ?Tobacco Use  ?  Smoking status: Never  ? Smokeless tobacco: Never  ?Vaping Use  ? Vaping Use: Never used  ?Substance and Sexual Activity  ? Alcohol use: No  ?  Comment: occassioal wine - 1-2 servings per week  ? Drug use: No  ? Sexual activity: Never  ?Other Topics Concern  ? Not on file  ?Social History Narrative  ? Not on file  ? ?Social Determinants of Health  ? ?Financial Resource Strain: Not on file  ?Food Insecurity: Not on file  ?Transportation Needs: Not on file  ?Physical Activity: Not on file  ?Stress: Not on file  ?Social Connections: Not on file  ?Intimate Partner Violence: Not on file  ? ? ?Outpatient Medications Prior to Visit  ?Medication Sig Dispense Refill  ? Fluticasone-Salmeterol (ADVAIR) 100-50 MCG/DOSE AEPB USE 1 INHALATION TWICE DAILY 60 each 5  ? lisinopril-hydrochlorothiazide (ZESTORETIC) 10-12.5 MG tablet TAKE ONE (1) TABLET BY MOUTH EVERY DAY 90 tablet 0  ? metoprolol tartrate (LOPRESSOR) 25 MG tablet Take 1 tablet (25 mg total) by mouth 2 (two) times daily. 180 tablet 1  ? rosuvastatin (CRESTOR) 10 MG tablet TAKE ONE (1) TABLET EACH DAY 90 tablet 1  ? trimethoprim-polymyxin b (POLYTRIM) ophthalmic solution Place 2 drops into both eyes every 4 (four) hours. 10 mL 0  ?  aspirin EC 81 MG tablet Take 81 mg by mouth daily. Swallow whole. (Patient not taking: Reported on 11/14/2021)    ? ?No facility-administered medications prior to visit.  ? ? ?Allergies  ?Allergen Reactions  ? Evista [Raloxifene Hydrochloride]   ? Evista [Raloxifene]   ?  syncope  ? ? ?Review of Systems  ?Constitutional: Negative.   ?HENT: Negative.    ?Eyes: Negative.  Negative for blurred vision.  ?Respiratory: Negative.    ?Cardiovascular: Negative.   ?Gastrointestinal: Negative.  Negative for abdominal pain.  ?Skin:  Negative for rash.  ?Neurological:  Positive for headaches. Negative for tingling and numbness.  ?Psychiatric/Behavioral:  Negative for memory loss.   ?All other systems reviewed and are negative. ? ?   ?Objective:  ?   ?Physical Exam ?Vitals and nursing note reviewed.  ?Constitutional:   ?   Appearance: She is obese.  ?HENT:  ?   Head: Normocephalic.  ? ?   Comments: Moderate hematoma from a fall  ?   Right Ear: External ear normal.  ?   Left Ear: External ear normal.  ?   Nose: Nose normal.  ?   Mouth/Throat:  ?   Mouth: Mucous membranes are moist.  ?Eyes:  ?   Conjunctiva/sclera: Conjunctivae normal.  ?Cardiovascular:  ?   Rate and Rhythm: Normal rate and regular rhythm.  ?   Pulses: Normal pulses.  ?   Heart sounds: Normal heart sounds.  ?Abdominal:  ?   General: Bowel sounds are normal.  ?Skin: ?   Findings: No rash.  ?Neurological:  ?   Mental Status: She is oriented to person, place, and time.  ?Psychiatric:     ?   Behavior: Behavior normal.  ? ? ?BP (!) 158/82   Pulse 88   Temp 98 ?F (36.7 ?C)   Ht 5' (1.524 m)   Wt 94 lb (42.6 kg)   SpO2 95%   BMI 18.36 kg/m?  ?Wt Readings from Last 3 Encounters:  ?11/14/21 94 lb (42.6 kg)  ?11/08/21 94 lb 6.4 oz (42.8 kg)  ?06/26/21 85 lb (38.6 kg)  ? ? ?Health Maintenance Due  ?Topic Date Due  ? Zoster Vaccines- Shingrix (1 of 2) Never done  ? COVID-19 Vaccine (3 - Booster for Moderna series) 09/08/2020  ? ? ?There are no preventive care reminders to display for this patient. ? ? ?Lab Results  ?Component Value Date  ? TSH 4.170 02/04/2019  ? ?Lab Results  ?Component Value Date  ? WBC 7.8 05/24/2021  ? HGB 14.6 05/24/2021  ? HCT 42.8 05/24/2021  ? MCV 92 05/24/2021  ? PLT 162 05/24/2021  ? ?Lab Results  ?Component Value Date  ? NA 137 05/24/2021  ? K 4.4 05/24/2021  ? CO2 25 05/24/2021  ? GLUCOSE 94 05/24/2021  ? BUN 30 05/24/2021  ? CREATININE 1.00 05/24/2021  ? BILITOT 0.6 05/24/2021  ? ALKPHOS 51 05/24/2021  ? AST 37 05/24/2021  ? ALT 24 05/24/2021  ? PROT 7.1 05/24/2021  ? ALBUMIN 4.5 05/24/2021  ? CALCIUM 9.8 05/24/2021  ? EGFR 53 (L) 05/24/2021  ? ?Lab Results  ?Component Value Date  ? CHOL 149 05/24/2021  ? ?Lab Results  ?Component Value Date  ? HDL 49 05/24/2021  ? ?Lab  Results  ?Component Value Date  ? [place] 83 05/24/2021  ? ?Lab Results  ?Component Value Date  ? TRIG 93 05/24/2021  ? ?Lab Results  ?Component Value Date  ? CHOLHDL 3.0 05/24/2021  ? ?No  results found for: HGBA1C ? ?   ?Assessment & Plan:  ?Patient referred for 3 to 6 hours ago.  Completed neuro assessment patient neuro assessment is intact.  Completed CT of head results pending.  Tylenol for pain, education provided for fall prevention printed handouts given.  Follow-up with worsening unresolved symptoms. ?Problem List Items Addressed This Visit   ?None ?Visit Diagnoses   ? ? Hematoma    -  Primary  ? Fall, initial encounter      ? Traumatic injury of head, initial encounter      ? Relevant Orders  ? CT HEAD WO CONTRAST (5MM) (Completed)  ? ?  ? ? ? ?No orders of the defined types were placed in this encounter. ? ? ? ?Ivy Lynn, NP ? ?

## 2021-11-14 NOTE — Patient Instructions (Signed)
Head Injury, Adult ?There are many types of head injuries. They can be as minor as a small bump. Some head injuries can be worse. Worse injuries include: ?A strong hit to the head that shakes the brain back and forth, causing damage (concussion). ?A bruise (contusion) of the brain. This means there is bleeding in the brain that can cause swelling. ?A cracked skull (skull fracture). ?Bleeding in the brain that gathers, gets thick (makes a clot), and forms a bump (hematoma). ?Most problems from a head injury come in the first 24 hours. However, you may still have side effects up to 7-10 days after your injury. It is important to watch your condition for any changes. You may need to be watched in the emergency department or urgent care, or you may need to stay in the hospital. ?What are the causes? ?There are many possible causes of a head injury. A serious head injury may be caused by: ?A car accident. ?Bicycle or motorcycle accidents. ?Sports injuries. ?Falls. ?Being hit by an object. ?What are the signs or symptoms? ?Symptoms of a head injury include a bruise, bump, or bleeding where the injury happened. Other physical symptoms may include: ?Headache. ?Feeling like you may vomit (nauseous) or vomiting. ?Dizziness. ?Blurred or double vision. ?Being uncomfortable around bright lights or loud noises. ?Shaking movements that you cannot control (seizures). ?Feeling tired. ?Trouble being woken up. ?Fainting or loss of consciousness. ?Mental or emotional symptoms may include: ?Feeling grumpy or cranky. ?Confusion and memory problems. ?Having trouble paying attention or concentrating. ?Changes in eating or sleeping habits. ?Feeling worried or nervous (anxious). ?Feeling sad (depressed). ?How is this treated? ?Treatment for this condition depends on how severe the injury is and the type of injury you have. The main goal is to prevent problems and to allow the brain time to heal. ?Mild head injury ?If you have a mild head  injury, you may be sent home, and treatment may include: ?Being watched. A responsible adult should stay with you for 24 hours after your injury and check on you often. ?Physical rest. ?Brain rest. ?Pain medicines. ?Severe head injury ?If you have a severe head injury, treatment may include: ?Being watched closely. This includes staying in the hospital. ?Medicines to: ?Help with pain. ?Prevent seizures. ?Help with brain swelling. ?Protecting your airway and using a machine that helps you breathe (ventilator). ?Treatments to watch for and manage swelling inside the brain. ?Brain surgery. This may be needed to: ?Remove a collection of blood or blood clots. ?Stop the bleeding. ?Remove a part of the skull. This allows room for the brain to swell. ?Follow these instructions at home: ?Activity ?Rest. ?Avoid activities that are hard or tiring. ?Make sure you get enough sleep. ?Let your brain rest. Do this by limiting activities that need a lot of thought or attention, such as: ?Watching TV. ?Playing memory games and puzzles. ?Job-related work or homework. ?Working on the computer, social media, and texting. ?Avoid activities that could cause another head injury until your doctor says it is okay. This includes playing sports. Having another head injury, especially before the first one has healed, can be dangerous. ?Ask your doctor when it is safe for you to go back to your normal activities, such as work or school. Ask your doctor for a step-by-step plan for slowly going back to your normal activities. ?Ask your doctor when you can drive, ride a bicycle, or use heavy machinery. Do not do these activities if you are dizzy. ?Lifestyle ? ?Do   not drink alcohol until your doctor says it is okay. ?Do not use drugs. ?If it is harder than usual to remember things, write them down. ?If you are easily distracted, try to do one thing at a time. ?Talk with family members or close friends when making important decisions. ?Tell your  friends, family, a trusted co-worker, and work manager about your injury, symptoms, and limits (restrictions). Have them watch for any problems that are new or getting worse. ?General instructions ?Take over-the-counter and prescription medicines only as told by your doctor. ?Have someone stay with you for 24 hours after your head injury. This person should watch you for any changes in your symptoms and be ready to get help. ?Keep all follow-up visits as told by your doctor. This is important. ?How is this prevented? ?Work on your balance and strength. This can help you avoid falls. ?Wear a seat belt when you are in a moving vehicle. ?Wear a helmet when you: ?Ride a bicycle. ?Ski. ?Do any other sport or activity that has a risk of injury. ?If you drink alcohol: ?Limit how much you use to: ?0-1 drink a day for nonpregnant women. ?0-2 drinks a day for men. ?Be aware of how much alcohol is in your drink. In the [place] one drink equals one 12 oz bottle of beer (355 mL), one 5 oz glass of wine (148 mL), or one 1? oz glass of hard liquor (44 mL). ?Make your home safer by: ?Getting rid of clutter from the floors and stairs. This includes things that can make you trip. ?Using grab bars in bathrooms and handrails by stairs. ?Placing non-slip mats on floors and in bathtubs. ?Putting more light in dim areas. ?Where to find more information ?Centers for Disease Control and Prevention: www.cdc.gov ?Get help right away if: ?You have: ?A very bad headache that is not helped by medicine. ?Trouble walking or weakness in your arms and legs. ?Clear or bloody fluid coming from your nose or ears. ?Changes in how you see (vision). ?A seizure. ?More confusion or more grumpy moods. ?Your symptoms get worse. ?You are sleepier than normal and have trouble staying awake. ?You lose your balance. ?The black centers of your eyes (pupils) change in size. ?Your speech is slurred. ?Your dizziness gets worse. ?You vomit. ?These symptoms may be an  emergency. Do not wait to see if the symptoms will go away. Get medical help right away. Call your local emergency services (911 in the [place] Do not drive yourself to the hospital. ?Summary ?Head injuries can be as minor as a small bump. Some head injuries can be worse. ?Treatment for this condition depends on how severe the injury is and the type of injury you have. ?Have someone stay with you for 24 hours after your head injury. ?Ask your doctor when it is safe for you to go back to your normal activities, such as work or school. ?To prevent a head injury, wear a seat belt in a car, wear a helmet when you use a bicycle, limit your alcohol use, and make your home safer. ?This information is not intended to replace advice given to you by your health care provider. Make sure you discuss any questions you have with your health care provider. ?Document Revised: 06/25/2019 Document Reviewed: 06/25/2019 ?Elsevier Patient Education ? 2022 [employer] ? ?

## 2021-11-20 ENCOUNTER — Ambulatory Visit (INDEPENDENT_AMBULATORY_CARE_PROVIDER_SITE_OTHER): Payer: MEDICARE | Admitting: Nurse Practitioner

## 2021-11-20 ENCOUNTER — Encounter: Payer: Self-pay | Admitting: Nurse Practitioner

## 2021-11-20 ENCOUNTER — Other Ambulatory Visit: Payer: Self-pay | Admitting: Nurse Practitioner

## 2021-11-20 VITALS — BP 133/80 | HR 68 | Temp 97.7°F | Ht 60.0 in | Wt 93.4 lb

## 2021-11-20 DIAGNOSIS — E785 Hyperlipidemia, unspecified: Secondary | ICD-10-CM

## 2021-11-20 DIAGNOSIS — I4821 Permanent atrial fibrillation: Secondary | ICD-10-CM | POA: Diagnosis not present

## 2021-11-20 DIAGNOSIS — J410 Simple chronic bronchitis: Secondary | ICD-10-CM

## 2021-11-20 DIAGNOSIS — M8588 Other specified disorders of bone density and structure, other site: Secondary | ICD-10-CM

## 2021-11-20 DIAGNOSIS — I10 Essential (primary) hypertension: Secondary | ICD-10-CM

## 2021-11-20 MED ORDER — FLUTICASONE-SALMETEROL 100-50 MCG/ACT IN AEPB: 1.0000 | INHALATION_SPRAY | Freq: Two times a day (BID) | RESPIRATORY_TRACT | 3 refills | Status: DC

## 2021-11-20 MED ORDER — METOPROLOL TARTRATE 25 MG PO TABS: 25.0000 mg | ORAL_TABLET | Freq: Two times a day (BID) | ORAL | 1 refills | Status: DC

## 2021-11-20 MED ORDER — ROSUVASTATIN CALCIUM 10 MG PO TABS: ORAL_TABLET | ORAL | 1 refills | Status: DC

## 2021-11-20 MED ORDER — LISINOPRIL-HYDROCHLOROTHIAZIDE 10-12.5 MG PO TABS: ORAL_TABLET | ORAL | 1 refills | Status: DC

## 2021-11-20 NOTE — Progress Notes (Signed)
? ?Subjective:  ? ? Patient ID: Morgan Spears, female    DOB: [birth year], 88 y.o.   MRN: [id number] ? ? ?Chief Complaint: medical management of chronic issues  ?  ? ?HPI: ? ?Morgan Spears is a 88 y.o. who identifies as a female who was assigned female at birth.  ? ?Social history: ?Lives with: by herself- her daughter checks on her daily ?Work history: retired ? ? ?Comes in today for follow up of the following chronic medical issues: ? ?1. Primary hypertension ?No c/o chest pain, sob or headache. Does not check blood pressure at home. ?BP Readings from Last 3 Encounters:  ?11/20/21 133/80  ?11/14/21 (!) 158/82  ?11/08/21 (!) 145/80  ? ? ? ? ?2. Permanent atrial fibrillation ([place]) ?No c/o palpitations or heart racing ? ?3. Hyperlipidemia with target LDL less than 100 ?Does watch diet but does not do any exercise. ?Lab Results  ?Component Value Date  ? CHOL 149 05/24/2021  ? HDL 49 05/24/2021  ? [place] 83 05/24/2021  ? TRIG 93 05/24/2021  ? CHOLHDL 3.0 05/24/2021  ? ? ? ?4. Simple chronic bronchitis ([place]) ?Is on advair inhaler daily. No c/o wheezing or cough ? ?5. Osteopenia of lumbar spine ?Last bone density was done in 2015. Patient is no longer doing these. ? ? ?New complaints: ?Patient had a fall last week and hit her head. Had large contusion. She was sent for head CT which was negative for any brain bleed. She denies any headache,nausea or blurred vision. ? ?Allergies  ?Allergen Reactions  ? Evista [Raloxifene Hydrochloride]   ? Evista [Raloxifene]   ?  syncope  ? ?Outpatient Encounter Medications as of 11/20/2021  ?Medication Sig  ? aspirin EC 81 MG tablet Take 81 mg by mouth daily. Swallow whole. (Patient not taking: Reported on 11/14/2021)  ? Fluticasone-Salmeterol (ADVAIR) 100-50 MCG/DOSE AEPB USE 1 INHALATION TWICE DAILY  ? lisinopril-hydrochlorothiazide (ZESTORETIC) 10-12.5 MG tablet TAKE ONE (1) TABLET BY MOUTH EVERY DAY  ? metoprolol tartrate (LOPRESSOR) 25 MG tablet Take 1 tablet (25 mg total) by  mouth 2 (two) times daily.  ? rosuvastatin (CRESTOR) 10 MG tablet TAKE ONE (1) TABLET EACH DAY  ? trimethoprim-polymyxin b (POLYTRIM) ophthalmic solution Place 2 drops into both eyes every 4 (four) hours.  ? ?No facility-administered encounter medications on file as of 11/20/2021.  ? ? ?No past surgical history on file. ? ?Family History  ?Problem Relation Age of Onset  ? Heart disease Mother   ? Hypertension Mother   ? Alcohol abuse Father   ? Cancer Sister   ? Emphysema Sister   ?     smoker  ? Heart disease Brother   ? Alcohol abuse Brother   ? ? ? ? ?Controlled substance contract: n/a ? ? ? ? ?Review of Systems  ?Constitutional:  Negative for diaphoresis.  ?Eyes:  Negative for pain.  ?Respiratory:  Negative for shortness of breath.   ?Cardiovascular:  Negative for chest pain, palpitations and leg swelling.  ?Gastrointestinal:  Negative for abdominal pain.  ?Endocrine: Negative for polydipsia.  ?Skin:  Negative for rash.  ?Neurological:  Negative for dizziness, weakness and headaches.  ?Hematological:  Does not bruise/bleed easily.  ?All other systems reviewed and are negative. ? ?   ?Objective:  ? Physical Exam ?Vitals and nursing note reviewed.  ?Constitutional:   ?   General: She is not in acute distress. ?   Appearance: Normal appearance. She is well-developed.  ?HENT:  ?   Head: Normocephalic.  ?  Right Ear: Tympanic membrane normal.  ?   Left Ear: Tympanic membrane normal.  ?   Nose: Nose normal.  ?   Mouth/Throat:  ?   Mouth: Mucous membranes are moist.  ?Eyes:  ?   Pupils: Pupils are equal, round, and reactive to light.  ?Neck:  ?   Vascular: No carotid bruit or JVD.  ?Cardiovascular:  ?   Rate and Rhythm: Normal rate and regular rhythm.  ?   Heart sounds: Normal heart sounds.  ?Pulmonary:  ?   Effort: Pulmonary effort is normal. No respiratory distress.  ?   Breath sounds: Normal breath sounds. No wheezing or rales.  ?Chest:  ?   Chest wall: No tenderness.  ?Abdominal:  ?   General: Bowel sounds are  normal. There is no distension or abdominal bruit.  ?   Palpations: Abdomen is soft. There is no hepatomegaly, splenomegaly, mass or pulsatile mass.  ?   Tenderness: There is no abdominal tenderness.  ?Musculoskeletal:     ?   General: Normal range of motion.  ?   Cervical back: Normal range of motion and neck supple.  ?Lymphadenopathy:  ?   Cervical: No cervical adenopathy.  ?Skin: ?   General: Skin is warm and dry.  ?Neurological:  ?   Mental Status: She is alert and oriented to person, place, and time.  ?   Deep Tendon Reflexes: Reflexes are normal and symmetric.  ?Psychiatric:     ?   Behavior: Behavior normal.     ?   Thought Content: Thought content normal.     ?   Judgment: Judgment normal.  ? ? ?BP 133/80   Pulse 68   Temp 97.7 ?F (36.5 ?C) (Oral)   Ht 5' (1.524 m)   Wt 93 lb 6.4 oz (42.4 kg)   BMI 18.24 kg/m?  ? ? ? ?   ?Assessment & Plan:  ?Morgan Spears comes in today with chief complaint of Medical Management of Chronic Issues ? ? ?Diagnosis and orders addressed: ? ?1. Primary hypertension ?Low sodium diet ?- lisinopril-hydrochlorothiazide (ZESTORETIC) 10-12.5 MG tablet; TAKE ONE (1) TABLET BY MOUTH EVERY DAY  Dispense: 90 tablet; Refill: 1 ?- CBC with Differential/Platelet ?- CMP14+EGFR ? ?2. Permanent atrial fibrillation ([place]) ?Avoid caffeien ?- metoprolol tartrate (LOPRESSOR) 25 MG tablet; Take 1 tablet (25 mg total) by mouth 2 (two) times daily.  Dispense: 180 tablet; Refill: 1 ? ?3. Hyperlipidemia with target LDL less than 100 ?Low fat diet ?- rosuvastatin (CRESTOR) 10 MG tablet; TAKE ONE (1) TABLET EACH DAY  Dispense: 90 tablet; Refill: 1 ? ?4. Simple chronic bronchitis ([facility]) ?- fluticasone-salmeterol (ADVAIR) 100-50 MCG/ACT AEPB; Inhale 1 puff into the lungs 2 (two) times daily.  Dispense: 1 each; Refill: 3 ? ?5. Osteopenia of lumbar spine ?Weight bearing exercise ?Fall prevention ? ? ?Labs pending ?Health Maintenance reviewed ?Diet and exercise encouraged ? ?Follow up plan: ?6  months ? ? ?Mary-Margaret Hassell Done, FNP ? ? ?

## 2021-11-20 NOTE — Patient Instructions (Signed)

## 2021-11-21 LAB — CMP14+EGFR
ALT: 19 IU/L (ref 0–32)
AST: 39 IU/L (ref 0–40)
Albumin/Globulin Ratio: 1.7 (ref 1.2–2.2)
Albumin: 4.3 g/dL (ref 3.5–4.6)
Alkaline Phosphatase: 54 IU/L (ref 44–121)
BUN/Creatinine Ratio: 12 (ref 12–28)
BUN: 13 mg/dL (ref 10–36)
Bilirubin Total: 0.6 mg/dL (ref 0.0–1.2)
CO2: 25 mmol/L (ref 20–29)
Calcium: 9.7 mg/dL (ref 8.7–10.3)
Chloride: 95 mmol/L — ABNORMAL LOW (ref 96–106)
Creatinine, Ser: 1.07 mg/dL — ABNORMAL HIGH (ref 0.57–1.00)
Globulin, Total: 2.5 g/dL (ref 1.5–4.5)
Glucose: 98 mg/dL (ref 70–99)
Potassium: 4.5 mmol/L (ref 3.5–5.2)
Sodium: 136 mmol/L (ref 134–144)
Total Protein: 6.8 g/dL (ref 6.0–8.5)
eGFR: 48 mL/min/{1.73_m2} — ABNORMAL LOW (ref >59)

## 2021-11-21 LAB — CBC WITH DIFFERENTIAL/PLATELET
Basophils Absolute: 0.1 10*3/uL (ref 0.0–0.2)
Basos: 1 %
EOS (ABSOLUTE): 0.1 10*3/uL (ref 0.0–0.4)
Eos: 1 %
Hematocrit: 42.3 % (ref 34.0–46.6)
Hemoglobin: 14.1 g/dL (ref 11.1–15.9)
Immature Grans (Abs): 0.1 10*3/uL (ref 0.0–0.1)
Immature Granulocytes: 1 %
Lymphocytes Absolute: 1.6 10*3/uL (ref 0.7–3.1)
Lymphs: 22 %
MCH: 30.7 pg (ref 26.6–33.0)
MCHC: 33.3 g/dL (ref 31.5–35.7)
MCV: 92 fL (ref 79–97)
Monocytes Absolute: 0.8 10*3/uL (ref 0.1–0.9)
Monocytes: 11 %
Neutrophils Absolute: 4.7 10*3/uL (ref 1.4–7.0)
Neutrophils: 64 %
Platelets: 230 10*3/uL (ref 150–450)
RBC: 4.6 x10E6/uL (ref 3.77–5.28)
RDW: 15.2 % (ref 11.7–15.4)
WBC: 7.4 10*3/uL (ref 3.4–10.8)

## 2022-01-16 ENCOUNTER — Other Ambulatory Visit: Payer: Self-pay | Admitting: Nurse Practitioner

## 2022-01-16 DIAGNOSIS — I10 Essential (primary) hypertension: Secondary | ICD-10-CM

## 2022-04-01 ENCOUNTER — Other Ambulatory Visit: Payer: Self-pay | Admitting: Nurse Practitioner

## 2022-04-01 DIAGNOSIS — I10 Essential (primary) hypertension: Secondary | ICD-10-CM

## 2022-04-08 IMAGING — CT CT HEAD W/O CM
3 of 4 series · 16 of 47 positions shown, 19 images · non-contrast
Comparison: None.

CLINICAL DATA: Fall, hit back of head



[Series 2: head w o · axial · 0.42mm/px · z∈[+67,+187]mm · 10 of 29 slices shown, 13 images]
[im 3/29  brain]
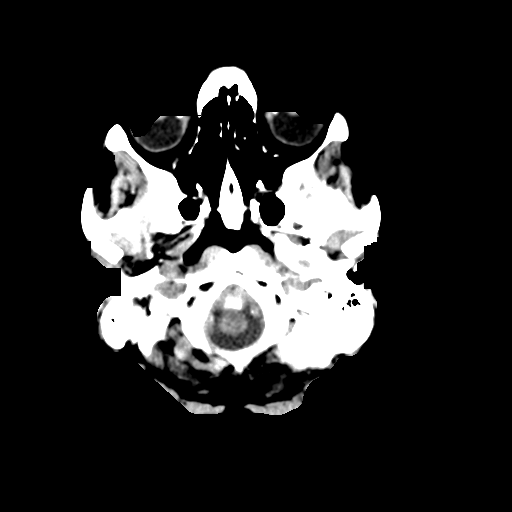
[im 3/29  bone]
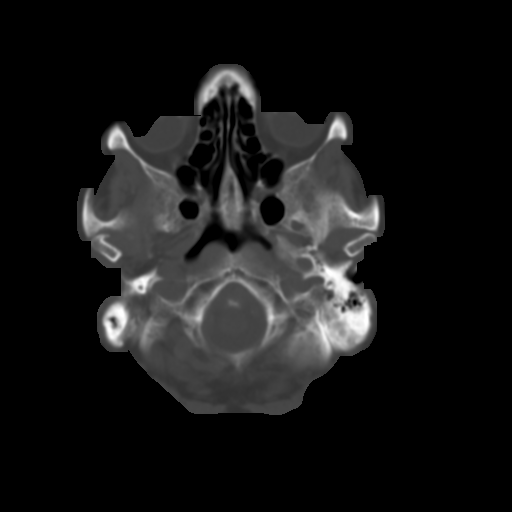
[im 5/29  brain]
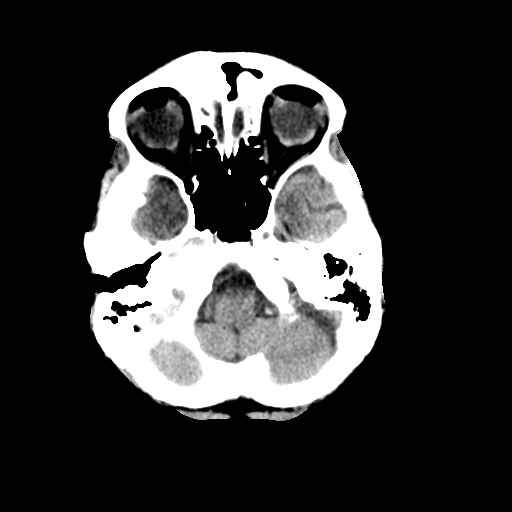
[im 9/29  brain]
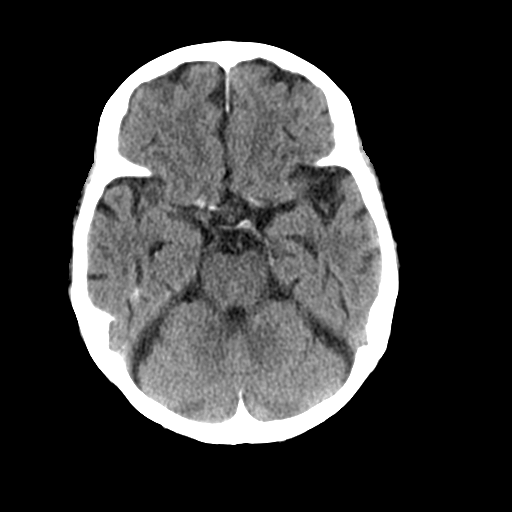
[im 11/29  brain]
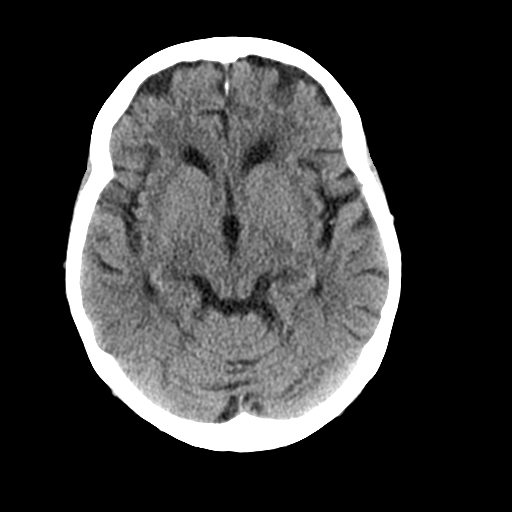
[im 13/29  brain]
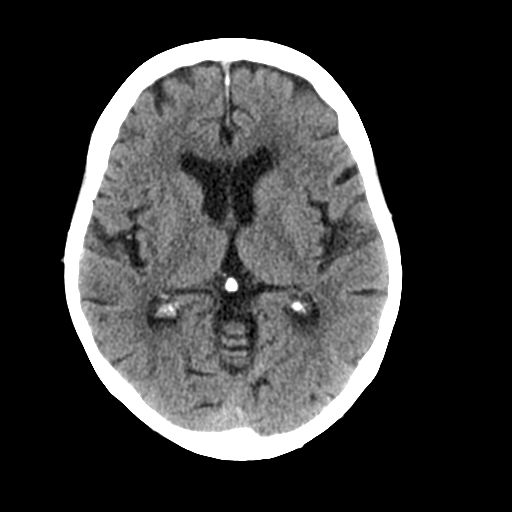
[im 13/29  bone]
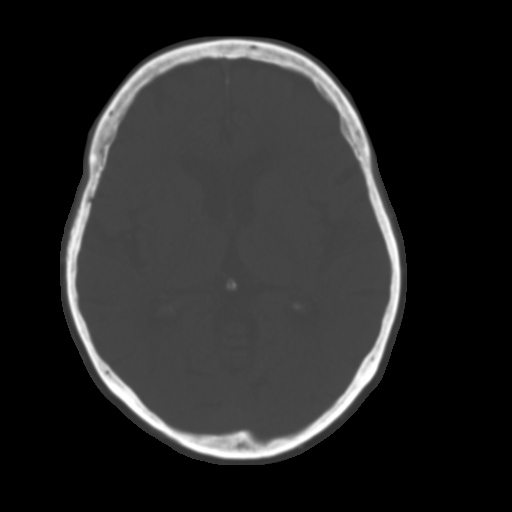
[im 17/29  brain]
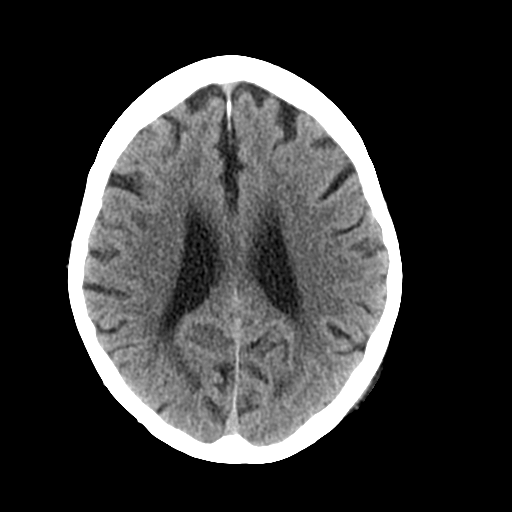
[im 19/29  brain]
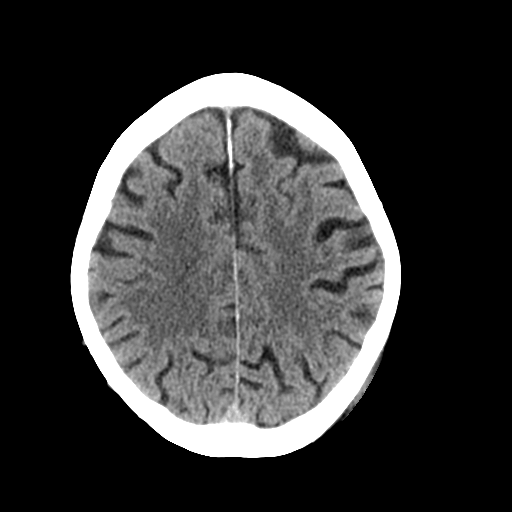
[im 21/29  brain]
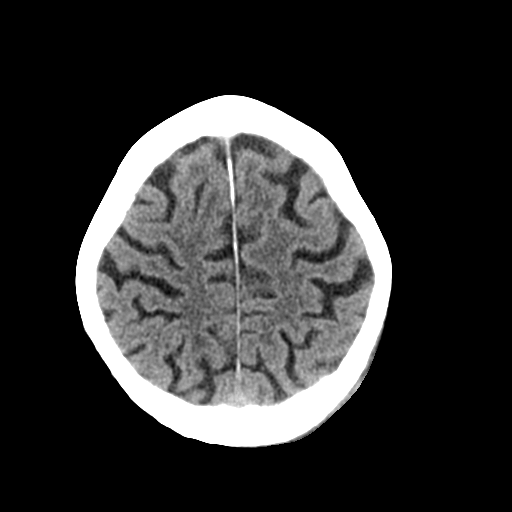
[im 25/29  brain]
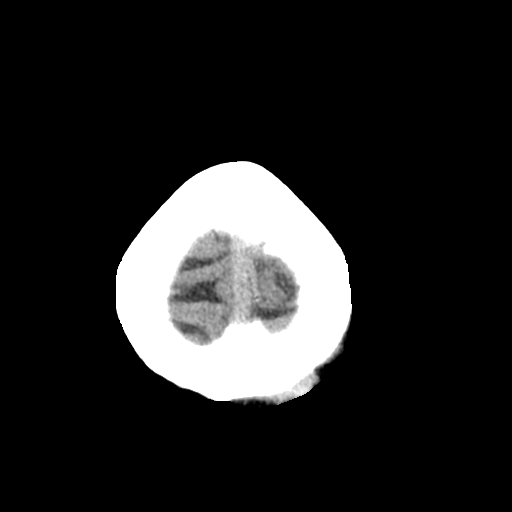
[im 25/29  bone]
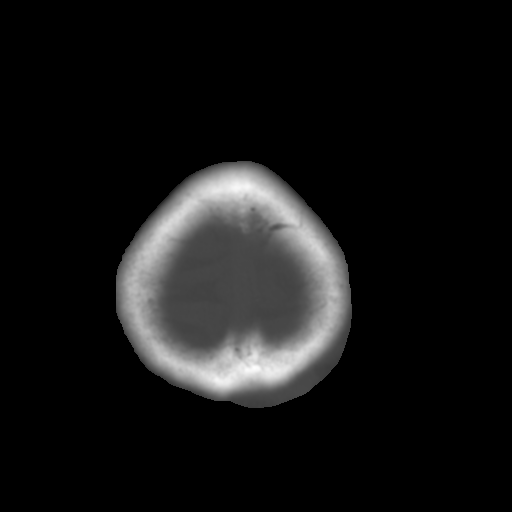
[im 27/29  brain]
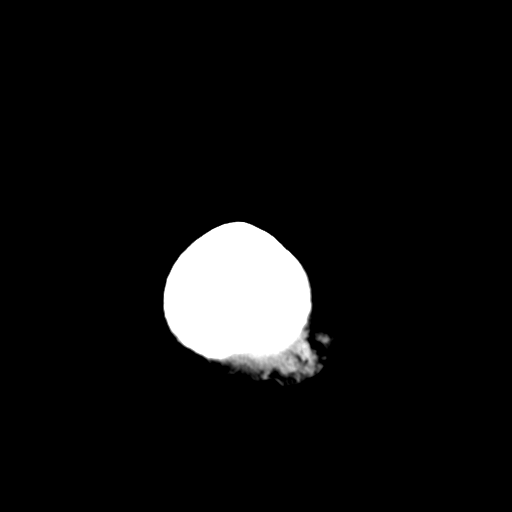

[Series 4: coronal soft · coronal · 0.29mm/px · 3 of 67 slices shown]
[im 23/67  brain]
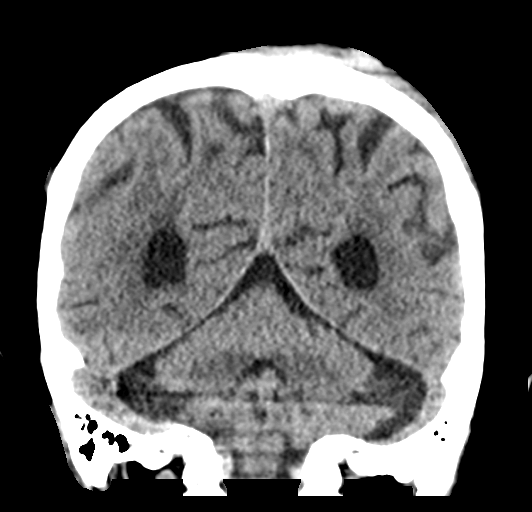
[im 30/67  brain]
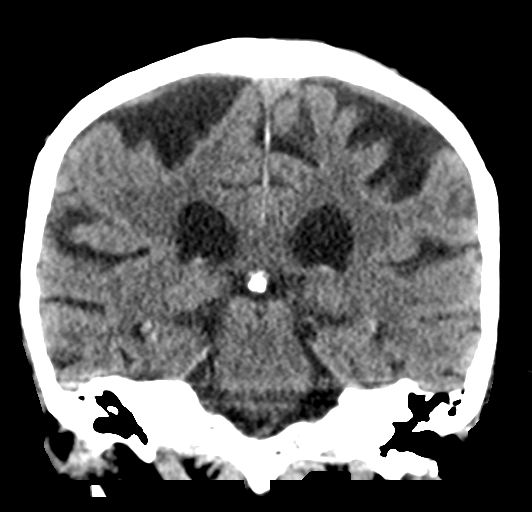
[im 37/67  brain]
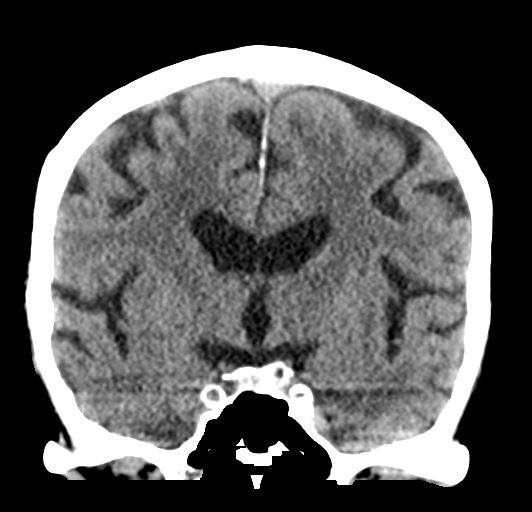

[Series 5: sagittal soft · sagittal · 0.31mm/px · 3 of 53 slices shown]
[im 18/53  brain]
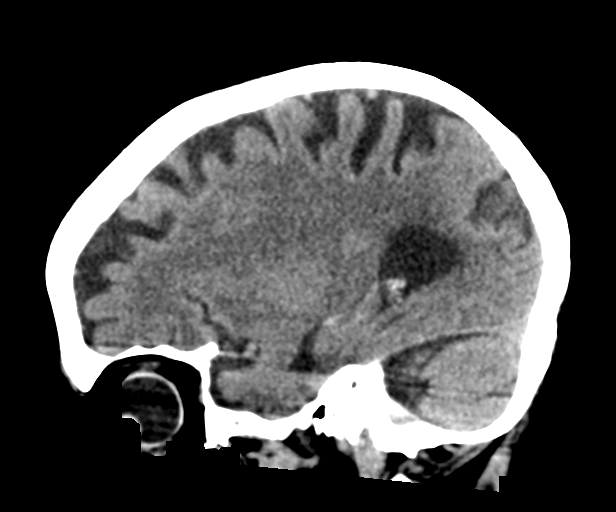
[im 27/53  brain]
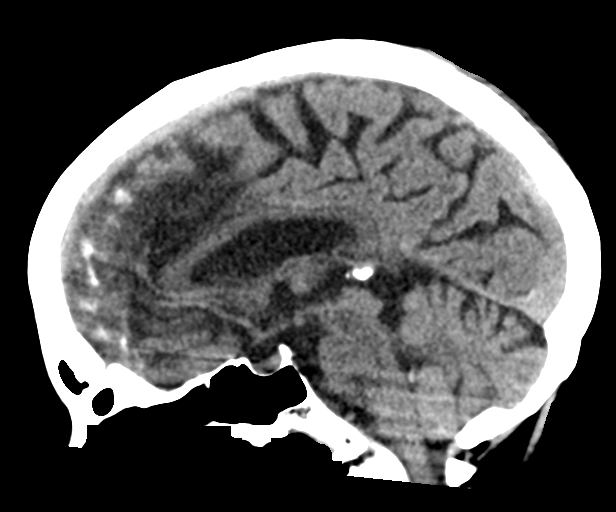
[im 35/53  brain]
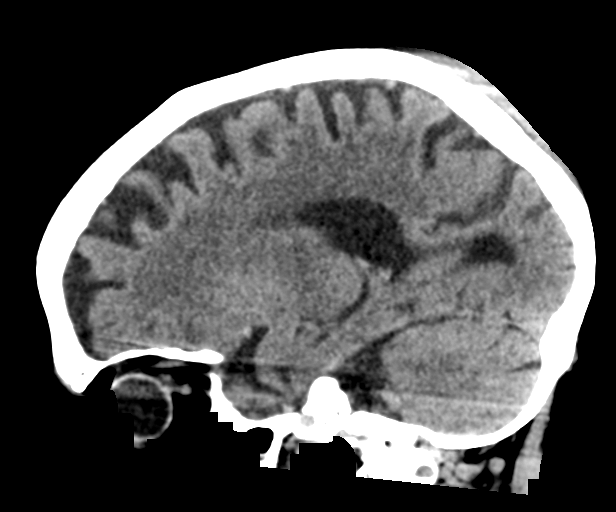

[16 of 47 positions shown; findings below may reference images not displayed]

FINDINGS: Brain: There is no evidence of acute intracranial hemorrhage,
extra-axial fluid collection, or acute infarct.

Parenchymal volume is within normal limits for age. The ventricles
are normal in size. Gray-white differentiation is preserved. There
is no significant burden of white matter microangiopathic change by
CT.

There is no mass lesion.  There is no mass effect or midline shift.

Vascular: There is calcification of the bilateral cavernous ICAs.

Skull: Normal. Negative for fracture or focal lesion.

Sinuses/Orbits: The imaged paranasal sinuses are clear. The globes
and orbits are unremarkable.

Other: There is a moderate left parietal scalp hematoma.
IMPRESSION: 1. No acute intracranial hemorrhage or calvarial fracture.
2. Moderate size left parietal scalp hematoma.

## 2022-05-10 ENCOUNTER — Other Ambulatory Visit: Payer: Self-pay | Admitting: Nurse Practitioner

## 2022-05-10 DIAGNOSIS — I10 Essential (primary) hypertension: Secondary | ICD-10-CM

## 2022-05-23 ENCOUNTER — Encounter: Payer: Self-pay | Admitting: Nurse Practitioner

## 2022-05-23 ENCOUNTER — Ambulatory Visit (INDEPENDENT_AMBULATORY_CARE_PROVIDER_SITE_OTHER): Payer: MEDICARE | Admitting: Nurse Practitioner

## 2022-05-23 VITALS — BP 179/106 | HR 113 | Temp 97.3°F | Ht 61.0 in | Wt 85.0 lb

## 2022-05-23 DIAGNOSIS — M8588 Other specified disorders of bone density and structure, other site: Secondary | ICD-10-CM

## 2022-05-23 DIAGNOSIS — I4821 Permanent atrial fibrillation: Secondary | ICD-10-CM

## 2022-05-23 DIAGNOSIS — L989 Disorder of the skin and subcutaneous tissue, unspecified: Secondary | ICD-10-CM | POA: Diagnosis not present

## 2022-05-23 DIAGNOSIS — I10 Essential (primary) hypertension: Secondary | ICD-10-CM | POA: Diagnosis not present

## 2022-05-23 DIAGNOSIS — Z23 Encounter for immunization: Secondary | ICD-10-CM

## 2022-05-23 DIAGNOSIS — J410 Simple chronic bronchitis: Secondary | ICD-10-CM

## 2022-05-23 DIAGNOSIS — E785 Hyperlipidemia, unspecified: Secondary | ICD-10-CM

## 2022-05-23 MED ORDER — ROSUVASTATIN CALCIUM 10 MG PO TABS: ORAL_TABLET | ORAL | 1 refills | Status: DC

## 2022-05-23 MED ORDER — METOPROLOL TARTRATE 50 MG PO TABS: 50.0000 mg | ORAL_TABLET | Freq: Two times a day (BID) | ORAL | 1 refills | Status: DC

## 2022-05-23 MED ORDER — METOPROLOL TARTRATE 25 MG PO TABS: 25.0000 mg | ORAL_TABLET | Freq: Two times a day (BID) | ORAL | 1 refills | Status: DC

## 2022-05-23 MED ORDER — LISINOPRIL-HYDROCHLOROTHIAZIDE 10-12.5 MG PO TABS: 1.0000 | ORAL_TABLET | Freq: Every day | ORAL | 1 refills | Status: DC

## 2022-05-23 NOTE — Patient Instructions (Signed)
Atrial Fibrillation  Atrial fibrillation is a type of heartbeat that is irregular or fast. If you have this condition, your heart beats without any order. This makes it hard for your heart to pump blood in a normal way. Atrial fibrillation may come and go, or it may become a long-lasting problem. If this condition is not treated, it can put you at higher risk for stroke, heart failure, and other heart problems. What are the causes? This condition may be caused by diseases that damage the heart. They include: High blood pressure. Heart failure. Heart valve disease. Heart surgery. Other causes include: Diabetes. Thyroid disease. Being overweight. Kidney disease. Sometimes the cause is not known. What increases the risk? You are more likely to develop this condition if: You are older. You smoke. You exercise often and very hard. You have a family history of this condition. You are a man. You use drugs. You drink a lot of alcohol. You have lung conditions, such as emphysema, pneumonia, or COPD. You have sleep apnea. What are the signs or symptoms? Common symptoms of this condition include: A feeling that your heart is beating very fast. Chest pain or discomfort. Feeling short of breath. Suddenly feeling light-headed or weak. Getting tired easily during activity. Fainting. Sweating. In some cases, there are no symptoms. How is this treated? Treatment for this condition depends on underlying conditions and how you feel when you have atrial fibrillation. They include: Medicines to: Prevent blood clots. Treat heart rate or heart rhythm problems. Using devices, such as a pacemaker, to correct heart rhythm problems. Doing surgery to remove the part of the heart that sends bad signals. Closing an area where clots can form in the heart (left atrial appendage). In some cases, your doctor will treat other underlying conditions. Follow these instructions at home: Medicines Take  over-the-counter and prescription medicines only as told by your doctor. Do not take any new medicines without first talking to your doctor. If you are taking blood thinners: Talk with your doctor before you take any medicines that have aspirin or NSAIDs, such as ibuprofen, in them. Take your medicine exactly as told by your doctor. Take it at the same time each day. Avoid activities that could hurt or bruise you. Follow instructions about how to prevent falls. Wear a bracelet that says you are taking blood thinners. Or, carry a card that lists what medicines you take. Lifestyle     Do not use any products that have nicotine or tobacco in them. These include cigarettes, e-cigarettes, and chewing tobacco. If you need help quitting, ask your doctor. Eat heart-healthy foods. Talk with your doctor about the right eating plan for you. Exercise regularly as told by your doctor. Do not drink alcohol. Lose weight if you are overweight. Do not use drugs, including cannabis. General instructions If you have a condition that causes breathing to stop for a short period of time (apnea), treat it as told by your doctor. Keep a healthy weight. Do not use diet pills unless your doctor says they are safe for you. Diet pills may make heart problems worse. Keep all follow-up visits as told by your doctor. This is important. Contact a doctor if: You notice a change in the speed, rhythm, or strength of your heartbeat. You are taking a blood-thinning medicine and you get more bruising. You get tired more easily when you move or exercise. You have a sudden change in weight. Get help right away if:  You have pain in   your chest or your belly (abdomen). You have trouble breathing. You have side effects of blood thinners, such as blood in your vomit, poop (stool), or pee (urine), or bleeding that cannot stop. You have any signs of a stroke. "BE FAST" is an easy way to remember the main warning signs: B -  Balance. Signs are dizziness, sudden trouble walking, or loss of balance. E - Eyes. Signs are trouble seeing or a change in how you see. F - Face. Signs are sudden weakness or loss of feeling in the face, or the face or eyelid drooping on one side. A - Arms. Signs are weakness or loss of feeling in an arm. This happens suddenly and usually on one side of the body. S - Speech. Signs are sudden trouble speaking, slurred speech, or trouble understanding what people say. T - Time. Time to call emergency services. Write down what time symptoms started. You have other signs of a stroke, such as: A sudden, very bad headache with no known cause. Feeling like you may vomit (nausea). Vomiting. A seizure. These symptoms may be an emergency. Do not wait to see if the symptoms will go away. Get medical help right away. Call your local emergency services (911 in the [place] Do not drive yourself to the hospital. Summary Atrial fibrillation is a type of heartbeat that is irregular or fast. You are at higher risk of this condition if you smoke, are older, have diabetes, or are overweight. Follow your doctor's instructions about medicines, diet, exercise, and follow-up visits. Get help right away if you have signs or symptoms of a stroke. Get help right away if you cannot catch your breath, or you have chest pain or discomfort. This information is not intended to replace advice given to you by your health care provider. Make sure you discuss any questions you have with your health care provider. Document Revised: 02/03/2019 Document Reviewed: 02/03/2019 Elsevier Patient Education  2023 Elsevier Inc.  

## 2022-05-23 NOTE — Progress Notes (Signed)
Subjective:    Patient ID: Morgan Spears, female    DOB: December 02, 1924, 88 y.o.   MRN: [id number]   Chief Complaint: medical management of chronic issues.   HPI:  Morgan Spears is a 88 y.o. who identifies as a female who was assigned female at birth.   Social history: Lives with: alone-daughter checks on her twice a day Work history: retired   Scientist, forensic in today for follow up of the following chronic medical issues:  1. Primary hypertension No c/o chest pain, sob or headache. Does not check BP at home. BP Readings from Last 3 Encounters:  05/23/22 (!) 179/106  11/20/21 133/80  11/14/21 (!) 158/82     2. Permanent atrial fibrillation (HCC) No c/o palpitations or heart racing.  3. Hyperlipidemia with target LDL less than 100 Does watch her diet but does not exercise. Lab Results  Component Value Date   CHOL 149 05/24/2021   HDL 49 05/24/2021   LDLCALC 83 05/24/2021   TRIG 93 05/24/2021   CHOLHDL 3.0 05/24/2021     4. Simple chronic bronchitis (HCC) Daily advair inhaler. No c/o wheezing or cough.  5. Osteopenia of lumbar spine Last bone density done in 2015. Pt no longer doing these scans.   New complaints: Per daughter, about 3 weeks ago when she went in to check on patient, she was on the floor next to her recliner. Has a small skin tear on R arm, but no other injuries.  Allergies  Allergen Reactions   Evista [Raloxifene Hydrochloride]    Evista [Raloxifene]     syncope   Outpatient Encounter Medications as of 05/23/2022  Medication Sig   fluticasone-salmeterol (ADVAIR) 100-50 MCG/ACT AEPB USE 1 INHALATION TWICE DAILY   lisinopril-hydrochlorothiazide (ZESTORETIC) 10-12.5 MG tablet TAKE ONE (1) TABLET BY MOUTH EVERY DAY   metoprolol tartrate (LOPRESSOR) 25 MG tablet Take 1 tablet (25 mg total) by mouth 2 (two) times daily.   rosuvastatin (CRESTOR) 10 MG tablet TAKE ONE (1) TABLET EACH DAY   trimethoprim-polymyxin b (POLYTRIM) ophthalmic solution Place 2  drops into both eyes every 4 (four) hours.   No facility-administered encounter medications on file as of 05/23/2022.    No past surgical history on file.  Family History  Problem Relation Age of Onset   Heart disease Mother    Hypertension Mother    Alcohol abuse Father    Cancer Sister    Emphysema Sister        smoker   Heart disease Brother    Alcohol abuse Brother       Controlled substance contract: n/a     Review of Systems  Constitutional:  Negative for diaphoresis.  Eyes:  Negative for pain.  Respiratory:  Negative for chest tightness and shortness of breath.   Cardiovascular:  Negative for chest pain, palpitations and leg swelling.  Gastrointestinal:  Negative for abdominal pain.  Endocrine: Negative for polydipsia.  Neurological:  Negative for dizziness, weakness, light-headedness and headaches.  Hematological:  Does not bruise/bleed easily.  All other systems reviewed and are negative.      Objective:   Physical Exam Vitals and nursing note reviewed.  Constitutional:      General: She is not in acute distress.    Appearance: Normal appearance. She is underweight.  HENT:     Head: Normocephalic and atraumatic.     Right Ear: Tympanic membrane normal.     Left Ear: Tympanic membrane normal.     Nose: Nose normal.  Mouth/Throat:     Mouth: Mucous membranes are moist.     Pharynx: Oropharynx is clear.  Eyes:     Conjunctiva/sclera: Conjunctivae normal.     Pupils: Pupils are equal, round, and reactive to light.  Neck:     Thyroid: No thyroid mass, thyromegaly or thyroid tenderness.     Vascular: No carotid bruit or JVD.  Cardiovascular:     Rate and Rhythm: Tachycardia present. Rhythm irregular.     Pulses: Normal pulses.     Heart sounds:     Gallop present.  Pulmonary:     Effort: Pulmonary effort is normal. No respiratory distress.     Breath sounds: Normal breath sounds. No wheezing, rhonchi or rales.  Chest:     Chest wall: No  tenderness.  Abdominal:     General: Bowel sounds are normal.     Palpations: Abdomen is soft.     Tenderness: There is no abdominal tenderness.  Musculoskeletal:        General: Normal range of motion.     Cervical back: Normal range of motion and neck supple.     Right lower leg: No edema.     Left lower leg: No edema.  Lymphadenopathy:     Cervical: No cervical adenopathy.  Skin:    General: Skin is warm and dry.     Capillary Refill: Capillary refill takes 2 to 3 seconds.     Findings: Ecchymosis and lesion present.     Comments: Partially scabbed, raised lesion on R cheek with surrounding ecchymosis. Dtr reports part of scab came off when patient slept on her right side 2 nights ago.  Neurological:     Mental Status: She is alert and oriented to person, place, and time.  Psychiatric:        Mood and Affect: Mood normal.        Behavior: Behavior normal.        Thought Content: Thought content normal.       BP (!) 179/106   Pulse (!) 113   Temp (!) 97.3 F (36.3 C) (Temporal)   Ht $R'5\' 1"'cz$  (1.549 m)   Wt 85 lb (38.6 kg)   SpO2 92%   BMI 16.06 kg/m  EKG- atrial Fib with hR of 118     Assessment & Plan:   Morgan Spears comes in today with chief complaint of Medical Management of Chronic Issues and Hematuria (Right side of her face after scab fell off x 1 day )   Diagnosis and orders addressed:  1. Primary hypertension Low sodium diet - lisinopril-hydrochlorothiazide (ZESTORETIC) 10-12.5 MG tablet; Take 1 tablet by mouth daily.  Dispense: 90 tablet; Refill: 1 - CBC with Differential/Platelet - CMP14+EGFR  2. Permanent atrial fibrillation (HCC) Increase metoprolol to $RemoveBefor'50mg'dytzXLTWoQBz$  BID Keep check of heart rate at home Patient does not want to see cardiology Avoid caffeine - EKG 12-Lead - metoprolol tartrate (LOPRESSOR) 50 MG tablet; Take 1 tablet (50 mg total) by mouth 2 (two) times daily.  Dispense: 180 tablet; Refill: 1  3. Hyperlipidemia with target LDL less than  100 Low fat diet - rosuvastatin (CRESTOR) 10 MG tablet; TAKE ONE (1) TABLET EACH DAY  Dispense: 90 tablet; Refill: 1 - Lipid panel  4. Simple chronic bronchitis ([place])  5. Osteopenia of lumbar spine Weight bearing exercises  6. Facial lesion Do not pick or scratch at area - Ambulatory referral to Dermatology  7. Need for immunization against influenza - Flu Vaccine QUAD High Dose(Fluad)  Labs pending Health Maintenance reviewed Diet and exercise encouraged  Follow up plan: 6 months   Mary-Margaret Hassell Done, FNP

## 2022-05-24 LAB — CBC WITH DIFFERENTIAL/PLATELET
Basophils Absolute: 0 10*3/uL (ref 0.0–0.2)
Basos: 1 %
EOS (ABSOLUTE): 0.3 10*3/uL (ref 0.0–0.4)
Eos: 5 %
Hematocrit: 39.1 % (ref 34.0–46.6)
Hemoglobin: 13 g/dL (ref 11.1–15.9)
Immature Grans (Abs): 0.1 10*3/uL (ref 0.0–0.1)
Immature Granulocytes: 1 %
Lymphocytes Absolute: 1.7 10*3/uL (ref 0.7–3.1)
Lymphs: 25 %
MCH: 31.3 pg (ref 26.6–33.0)
MCHC: 33.2 g/dL (ref 31.5–35.7)
MCV: 94 fL (ref 79–97)
Monocytes Absolute: 0.8 10*3/uL (ref 0.1–0.9)
Monocytes: 12 %
Neutrophils Absolute: 3.9 10*3/uL (ref 1.4–7.0)
Neutrophils: 56 %
Platelets: 190 10*3/uL (ref 150–450)
RBC: 4.16 x10E6/uL (ref 3.77–5.28)
RDW: 14.8 % (ref 11.7–15.4)
WBC: 6.9 10*3/uL (ref 3.4–10.8)

## 2022-05-24 LAB — CMP14+EGFR
ALT: 20 IU/L (ref 0–32)
AST: 34 IU/L (ref 0–40)
Albumin/Globulin Ratio: 1.5 (ref 1.2–2.2)
Albumin: 4.3 g/dL (ref 3.6–4.6)
Alkaline Phosphatase: 127 IU/L — ABNORMAL HIGH (ref 44–121)
BUN/Creatinine Ratio: 17 (ref 12–28)
BUN: 18 mg/dL (ref 10–36)
Bilirubin Total: 0.9 mg/dL (ref 0.0–1.2)
CO2: 26 mmol/L (ref 20–29)
Calcium: 9.8 mg/dL (ref 8.7–10.3)
Chloride: 94 mmol/L — ABNORMAL LOW (ref 96–106)
Creatinine, Ser: 1.03 mg/dL — ABNORMAL HIGH (ref 0.57–1.00)
Globulin, Total: 2.9 g/dL (ref 1.5–4.5)
Glucose: 91 mg/dL (ref 70–99)
Potassium: 4.3 mmol/L (ref 3.5–5.2)
Sodium: 137 mmol/L (ref 134–144)
Total Protein: 7.2 g/dL (ref 6.0–8.5)
eGFR: 50 mL/min/{1.73_m2} — ABNORMAL LOW (ref >59)

## 2022-05-24 LAB — LIPID PANEL
Chol/HDL Ratio: 3.1 ratio (ref 0.0–4.4)
Cholesterol, Total: 131 mg/dL (ref 100–199)
HDL: 42 mg/dL (ref >39)
LDL Chol Calc (NIH): 72 mg/dL (ref 0–99)
Triglycerides: 89 mg/dL (ref 0–149)
VLDL Cholesterol Cal: 17 mg/dL (ref 5–40)

## 2022-07-02 DIAGNOSIS — Z23 Encounter for immunization: Secondary | ICD-10-CM | POA: Diagnosis not present

## 2022-09-06 ENCOUNTER — Other Ambulatory Visit: Payer: Self-pay | Admitting: Nurse Practitioner

## 2022-09-06 DIAGNOSIS — I4821 Permanent atrial fibrillation: Secondary | ICD-10-CM

## 2022-10-08 ENCOUNTER — Other Ambulatory Visit: Payer: Self-pay | Admitting: Nurse Practitioner

## 2022-10-08 DIAGNOSIS — J410 Simple chronic bronchitis: Secondary | ICD-10-CM

## 2022-10-15 ENCOUNTER — Other Ambulatory Visit: Payer: Self-pay | Admitting: Nurse Practitioner

## 2022-10-15 DIAGNOSIS — I10 Essential (primary) hypertension: Secondary | ICD-10-CM

## 2022-10-28 ENCOUNTER — Ambulatory Visit (INDEPENDENT_AMBULATORY_CARE_PROVIDER_SITE_OTHER): Payer: MEDICARE | Admitting: Nurse Practitioner

## 2022-10-28 ENCOUNTER — Encounter: Payer: Self-pay | Admitting: Nurse Practitioner

## 2022-10-28 VITALS — BP 148/76 | HR 97 | Temp 97.4°F | Resp 20 | Ht 61.0 in | Wt 95.0 lb

## 2022-10-28 DIAGNOSIS — R609 Edema, unspecified: Secondary | ICD-10-CM | POA: Diagnosis not present

## 2022-10-28 DIAGNOSIS — I10 Essential (primary) hypertension: Secondary | ICD-10-CM

## 2022-10-28 MED ORDER — LISINOPRIL 40 MG PO TABS: 40.0000 mg | ORAL_TABLET | Freq: Every day | ORAL | 3 refills | Status: DC

## 2022-10-28 MED ORDER — FUROSEMIDE 20 MG PO TABS: 20.0000 mg | ORAL_TABLET | Freq: Every day | ORAL | 3 refills | Status: DC

## 2022-10-28 NOTE — Patient Instructions (Signed)
[facility] An [employer] is a type of bandage (dressing) for the foot and leg. The dressing is a wrap made of gauze that is soaked with a medicine called zinc oxide. The gauze may also include other lotions and medicines that help in wound healing, such as calamine. An Unna boot may be used to: Treat open sores (venous ulcers) or graft sites on the foot, heel, or leg. Help with swelling from conditions that affect the veins or lymphatic system (lymphedema). Treat skin conditions such as inflammation caused by poor blood flow (stasis dermatitis). Heal wounds on parts of the body below the hips (lower extremities). The dressing is applied by a health care provider. The gauze is wrapped around your lower extremity in several layers that overlap. These layers usually start at the toes and go up to the knee. A dry outer wrap goes over the medicated wrap for support and pressure (compression). Before applying the [employer], your health care provider will clean your leg and foot and may apply an antibiotic. You may be asked to raise (elevate) your leg for a while to reduce swelling before the boot is put on. The boot will dry and harden after it is applied. It may need to be changed or replaced once or twice a week. Follow these instructions at home: [place] as told by your health care provider. You may need to wear a slipper or shoe over the boot that is one or two sizes larger than normal. Do not stick anything inside the boot to scratch your skin. Doing that increases your risk of infection. Check the skin around the boot every day. Tell your health care provider about any concerns. Keep your [employer] clean and dry. Check the area around the boot every day for signs of infection. Check for: Redness, swelling, or pain in your foot or toes. Fluid or blood coming from the boot. Warmth. Pus or a bad smell. A rash, itching, or red, swollen areas of skin (hives). Remove the boot  and call your health care provider if you have signs of poor blood flow, such as: Your toes tingle or become numb. Your toes turn cold or turn blue or pale. Your toes are more swollen or painful. You cannot move your toes. Bathing Do not take baths, swim, or use a hot tub until your health care provider approves. Ask your health care provider if you may take showers. You may only be allowed to take sponge baths. If your health care provider says that you can take a bath or shower: Do not let the Unna boot get wet. Cover the boot with a watertight covering when you shower. Keep your leg with the boot out of the bathtub when you take a bath. Activity Rest as told by your health care provider. Do not sit for a long time without moving. Get up to take short walks every 1-2 hours. This will improve blood flow and breathing. Ask for help if you feel weak or unsteady. You may walk with the boot once it has dried. Ask your health care provider how much walking is safe for you. General instructions Take over-the-counter and prescription medicines only as told by your health care provider. Keep your leg elevated above the level of your heart while you are sitting or lying down. This will decrease swelling. Do not sit with your knee bent for long periods of time. Do not use any products that contain nicotine  or tobacco. These products include cigarettes, chewing tobacco, and vaping devices, such as e-cigarettes. If you need help quitting, ask your health care provider. Keep all follow-up visits. Your health care provider will change your boot once or twice a week until it is no longer needed. Contact a health care provider if: Your skin feels itchy inside the boot. You feel burning or have a rash or hives in the boot area. You have a fever or chills. You have any signs of infection. You have more numbness or pain in your foot or toes. The skin on your foot or toes changes colors. This may include the  skin turning blue or pale or having patchy areas with spots. Your boot has been damaged or feels like it no longer fits like it should. This information is not intended to replace advice given to you by your health care provider. Make sure you discuss any questions you have with your health care provider. Document Revised: 01/07/2022 Document Reviewed: 01/07/2022 Elsevier Patient Education  [employer].

## 2022-10-28 NOTE — Progress Notes (Signed)
   Subjective:    Patient ID: Morgan Spears, female    DOB: 1924-11-06, 89 y.o.   MRN: [id number]   Chief Complaint: Swelling in legs and ankles   HPI Patient brought in by her daughter with fluid in lower ext. Started about 1 month ago and is worsening. Patient Active Problem List   Diagnosis Date Noted   Permanent atrial fibrillation ([place]) 08/13/2018   Simple chronic bronchitis ([place]) 04/08/2016   Primary hypertension 12/08/2014   Osteopenia    Hyperlipidemia with target LDL less than 100    Diffuse cystic mastopathy    Postmenopausal atrophic vaginitis        Review of Systems  Constitutional:  Negative for diaphoresis.  Eyes:  Negative for pain.  Respiratory:  Negative for shortness of breath.   Cardiovascular:  Negative for chest pain, palpitations and leg swelling.  Gastrointestinal:  Negative for abdominal pain.  Endocrine: Negative for polydipsia.  Skin:  Negative for rash.  Neurological:  Negative for dizziness, weakness and headaches.  Hematological:  Does not bruise/bleed easily.  All other systems reviewed and are negative.      Objective:   Physical Exam Vitals reviewed.  Constitutional:      Appearance: Normal appearance.  Cardiovascular:     Rate and Rhythm: Normal rate and regular rhythm.     Heart sounds: Normal heart sounds.  Pulmonary:     Effort: Pulmonary effort is normal.     Breath sounds: Normal breath sounds.  Musculoskeletal:     Right lower leg: Edema (2+) present.     Left lower leg: Edema (2+) present.  Skin:    General: Skin is warm.  Neurological:     General: No focal deficit present.     Mental Status: She is alert and oriented to person, place, and time.  Psychiatric:        Mood and Affect: Mood normal.        Behavior: Behavior normal.    BP (!) 148/76   Pulse 97   Temp (!) 97.4 F (36.3 C) (Temporal)   Resp 20   Ht '5\' 1"'$  (1.549 m)   Wt 95 lb (43.1 kg)   SpO2 95%   BMI 17.95 kg/m          Assessment &  Plan:  Morgan Spears in today with chief complaint of Swelling in legs and ankles   1. Peripheral edema Elevate legs while sitting Unna boots bil Follow up on friday - furosemide (LASIX) 20 MG tablet; Take 1 tablet (20 mg total) by mouth daily.  Dispense: 30 tablet; Refill: 3 - Apply unna boot  2. Primary hypertension Stop lisinoril hctz Start lisinopril '40mg'$  daily - lisinopril (ZESTRIL) 40 MG tablet; Take 1 tablet (40 mg total) by mouth daily.  Dispense: 90 tablet; Refill: 3    The above assessment and management plan was discussed with the patient. The patient verbalized understanding of and has agreed to the management plan. Patient is aware to call the clinic if symptoms persist or worsen. Patient is aware when to return to the clinic for a follow-up visit. Patient educated on when it is appropriate to go to the emergency department.   Mary-Margaret Hassell Done, FNP

## 2022-11-01 ENCOUNTER — Ambulatory Visit (INDEPENDENT_AMBULATORY_CARE_PROVIDER_SITE_OTHER): Payer: MEDICARE | Admitting: Nurse Practitioner

## 2022-11-01 ENCOUNTER — Encounter: Payer: Self-pay | Admitting: Nurse Practitioner

## 2022-11-01 VITALS — BP 158/94 | HR 88 | Temp 96.9°F | Resp 20 | Ht 61.0 in | Wt 93.0 lb

## 2022-11-01 DIAGNOSIS — R609 Edema, unspecified: Secondary | ICD-10-CM | POA: Diagnosis not present

## 2022-11-01 NOTE — Progress Notes (Signed)
   Subjective:    Patient ID: Morgan Spears, female    DOB: 1924-11-14, 89 y.o.   MRN: [id number]   Chief Complaint: recheck legs   HPI  Patient Active Problem List   Diagnosis Date Noted   Permanent atrial fibrillation ([place]) 08/13/2018   Simple chronic bronchitis ([place]) 04/08/2016   Primary hypertension 12/08/2014   Osteopenia    Hyperlipidemia with target LDL less than 100    Diffuse cystic mastopathy    Postmenopausal atrophic vaginitis    Patient was seen on 10/28/22 with lower ext edema. We applies unna boots and increased her lasix daily. She is here today  to have unna boots removed.    Review of Systems  Constitutional:  Negative for diaphoresis.  Eyes:  Negative for pain.  Respiratory:  Negative for shortness of breath.   Cardiovascular:  Negative for chest pain, palpitations and leg swelling.  Gastrointestinal:  Negative for abdominal pain.  Endocrine: Negative for polydipsia.  Skin:  Negative for rash.  Neurological:  Negative for dizziness, weakness and headaches.  Hematological:  Does not bruise/bleed easily.  All other systems reviewed and are negative.      Objective:   Physical Exam Constitutional:      Appearance: Normal appearance.  Cardiovascular:     Rate and Rhythm: Normal rate and regular rhythm.     Heart sounds: Normal heart sounds.     Comments: 1+ pedal pulses bil Pulmonary:     Effort: Pulmonary effort is normal.     Breath sounds: Normal breath sounds.  Musculoskeletal:     Right lower leg: No edema.     Left lower leg: No edema.  Skin:    Comments: Large hematoma on left posterior shin   Neurological:     General: No focal deficit present.     Mental Status: She is alert and oriented to person, place, and time.  Psychiatric:        Mood and Affect: Mood normal.        Behavior: Behavior normal.    BP (!) 158/94   Pulse 88   Temp (!) 96.9 F (36.1 C) (Temporal)   Resp 20   Ht 5\' 1"  (1.549 m)   Wt 93 lb (42.2 kg)   SpO2  98%   BMI 17.57 kg/m         Assessment & Plan:   Morgan Spears in today with chief complaint of recheck legs   1. Peripheral edema Continue lasix at current dose. Ice hematoma on back of left lower leg Check pulses daily Elevate when sitting RTO prn    The above assessment and management plan was discussed with the patient. The patient verbalized understanding of and has agreed to the management plan. Patient is aware to call the clinic if symptoms persist or worsen. Patient is aware when to return to the clinic for a follow-up visit. Patient educated on when it is appropriate to go to the emergency department.   Mary-Margaret Hassell Done, FNP

## 2022-11-21 ENCOUNTER — Ambulatory Visit (INDEPENDENT_AMBULATORY_CARE_PROVIDER_SITE_OTHER): Payer: 59 | Admitting: Nurse Practitioner

## 2022-11-21 VITALS — BP 155/85 | HR 93 | Temp 97.2°F | Resp 20 | Ht 61.0 in | Wt 92.0 lb

## 2022-11-21 DIAGNOSIS — J410 Simple chronic bronchitis: Secondary | ICD-10-CM

## 2022-11-21 DIAGNOSIS — M8588 Other specified disorders of bone density and structure, other site: Secondary | ICD-10-CM | POA: Diagnosis not present

## 2022-11-21 DIAGNOSIS — I10 Essential (primary) hypertension: Secondary | ICD-10-CM

## 2022-11-21 DIAGNOSIS — E785 Hyperlipidemia, unspecified: Secondary | ICD-10-CM

## 2022-11-21 DIAGNOSIS — I4821 Permanent atrial fibrillation: Secondary | ICD-10-CM

## 2022-11-21 DIAGNOSIS — R609 Edema, unspecified: Secondary | ICD-10-CM

## 2022-11-21 MED ORDER — FUROSEMIDE 20 MG PO TABS: 20.0000 mg | ORAL_TABLET | Freq: Every day | ORAL | 1 refills | Status: DC

## 2022-11-21 MED ORDER — METOPROLOL TARTRATE 50 MG PO TABS: ORAL_TABLET | ORAL | 1 refills | Status: DC

## 2022-11-21 MED ORDER — LISINOPRIL 40 MG PO TABS: 40.0000 mg | ORAL_TABLET | Freq: Every day | ORAL | 1 refills | Status: DC

## 2022-11-21 MED ORDER — ROSUVASTATIN CALCIUM 10 MG PO TABS: ORAL_TABLET | ORAL | 1 refills | Status: DC

## 2022-11-21 NOTE — Addendum Note (Signed)
Addended by: Chevis Pretty on: 11/21/2022 02:39 PM   Modules accepted: Orders

## 2022-11-21 NOTE — Progress Notes (Signed)
Subjective:    Patient ID: Loreta Ave, female    DOB: 02/24/1925, 89 y.o.   MRN: [id number]   Chief Complaint: medical management of chronic issues     HPI:  Morgan Spears is a 89 y.o. who identifies as a female who was assigned female at birth.   Social history: Lives with: lives alone- daughter checks on her daily Work history: retired   Scientist, forensic in today for follow up of the following chronic medical issues:  1. Primary hypertension No c/o chest pain, sob or headache. Does not check blood pressure at home BP Readings from Last 3 Encounters:  11/21/22 (!) 176/103  11/01/22 (!) 158/94  10/28/22 (!) 148/76    2. Permanent atrial fibrillation (HCC) No palpitations or heart racing  3. Hyperlipidemia with target LDL less than 100 Does not watch diet and does no exercise Lab Results  Component Value Date   CHOL 131 05/23/2022   HDL 42 05/23/2022   LDLCALC 72 05/23/2022   TRIG 89 05/23/2022   CHOLHDL 3.1 05/23/2022     4. Simple chronic bronchitis (HCC) Is on advair and denies any chronic cough  5. Osteopenia of lumbar spine No weight bearing exercises   New complaints: None today  Allergies  Allergen Reactions   Evista [Raloxifene Hydrochloride]    Evista [Raloxifene]     syncope   Outpatient Encounter Medications as of 11/21/2022  Medication Sig   fluticasone-salmeterol (ADVAIR) 100-50 MCG/ACT AEPB USE 1 INHALATION TWICE DAILY   furosemide (LASIX) 20 MG tablet Take 1 tablet (20 mg total) by mouth daily.   lisinopril (ZESTRIL) 40 MG tablet Take 1 tablet (40 mg total) by mouth daily.   metoprolol tartrate (LOPRESSOR) 50 MG tablet TAKE ONE (1) TABLET BY MOUTH TWO (2) TIMES DAILY   rosuvastatin (CRESTOR) 10 MG tablet TAKE ONE (1) TABLET EACH DAY   No facility-administered encounter medications on file as of 11/21/2022.    No past surgical history on file.  Family History  Problem Relation Age of Onset   Heart disease Mother    Hypertension  Mother    Alcohol abuse Father    Cancer Sister    Emphysema Sister        smoker   Heart disease Brother    Alcohol abuse Brother       Controlled substance contract: n/a     Review of Systems  Constitutional:  Negative for diaphoresis.  Eyes:  Negative for pain.  Respiratory:  Negative for shortness of breath.   Cardiovascular:  Positive for leg swelling. Negative for chest pain and palpitations.  Gastrointestinal:  Negative for abdominal pain.  Endocrine: Negative for polydipsia.  Skin:  Negative for rash.  Neurological:  Negative for dizziness, weakness and headaches.  Hematological:  Does not bruise/bleed easily.  All other systems reviewed and are negative.      Objective:   Physical Exam Constitutional:      Appearance: Normal appearance.  Cardiovascular:     Rate and Rhythm: Normal rate and regular rhythm.     Heart sounds: Murmur (4/6 systolic) heard.  Pulmonary:     Effort: Pulmonary effort is normal.     Breath sounds: Normal breath sounds.  Abdominal:     General: Abdomen is flat. Bowel sounds are normal.     Palpations: Abdomen is soft.     Tenderness: There is no abdominal tenderness.  Musculoskeletal:     Right lower leg: Edema (1+) present.     Left lower  leg: Edema (1+) present.  Neurological:     General: No focal deficit present.     Mental Status: She is alert and oriented to person, place, and time.  Psychiatric:        Mood and Affect: Mood normal.        Behavior: Behavior normal.    BP (!) 155/85   Pulse 93   Temp (!) 97.2 F (36.2 C) (Temporal)   Resp 20   Ht 5\' 1"  (1.549 m)   Wt 92 lb (41.7 kg)   SpO2 94%   BMI 17.38 kg/m         Assessment & Plan:   Dorothye Chesebro comes in today with chief complaint of Medical Management of Chronic Issues   Diagnosis and orders addressed:  1. Primary hypertension Low sodium diet Keep diary of blood pressure at home/- call me next week with results - lisinopril (ZESTRIL) 40 MG  tablet; Take 1 tablet (40 mg total) by mouth daily.  Dispense: 90 tablet; Refill: 1  2. Permanent atrial fibrillation (HCC) Avoid caffeine - metoprolol tartrate (LOPRESSOR) 50 MG tablet; TAKE ONE (1) TABLET BY MOUTH TWO (2) TIMES DAILY  Dispense: 180 tablet; Refill: 1  3. Hyperlipidemia with target LDL less than 100 Low fat diet - rosuvastatin (CRESTOR) 10 MG tablet; TAKE ONE (1) TABLET EACH DAY  Dispense: 90 tablet; Refill: 1  4. Simple chronic bronchitis ([place])  5. Osteopenia of lumbar spine Weight bearing exercise when can tolerate  6. Peripheral edema Elevate legs when sitting - furosemide (LASIX) 20 MG tablet; Take 1 tablet (20 mg total) by mouth daily.  Dispense: 90 tablet; Refill: 1   Labs pending Health Maintenance reviewed Diet and exercise encouraged  Follow up plan: 6 months   Mary-Margaret Hassell Done, FNP

## 2022-11-21 NOTE — Patient Instructions (Signed)
Fall Prevention in the Home, Adult Falls can cause injuries and can happen to people of all ages. There are many things you can do to make your home safer and to help prevent falls. What actions can I take to prevent falls? General information Use good lighting in all rooms. Make sure to: Replace any light bulbs that burn out. Turn on the lights in dark areas and use night-lights. Keep items that you use often in easy-to-reach places. Lower the shelves around your home if needed. Move furniture so that there are clear paths around it. Do not use throw rugs or other things on the floor that can make you trip. If any of your floors are uneven, fix them. Add color or contrast paint or tape to clearly mark and help you see: Grab bars or handrails. First and last steps of staircases. Where the edge of each step is. If you use a ladder or stepladder: Make sure that it is fully opened. Do not climb a closed ladder. Make sure the sides of the ladder are locked in place. Have someone hold the ladder while you use it. Know where your pets are as you move through your home. What can I do in the bathroom?     Keep the floor dry. Clean up any water on the floor right away. Remove soap buildup in the bathtub or shower. Buildup makes bathtubs and showers slippery. Use non-skid mats or decals on the floor of the bathtub or shower. Attach bath mats securely with double-sided, non-slip rug tape. If you need to sit down in the shower, use a non-slip stool. Install grab bars by the toilet and in the bathtub and shower. Do not use towel bars as grab bars. What can I do in the bedroom? Make sure that you have a light by your bed that is easy to reach. Do not use any sheets or blankets on your bed that hang to the floor. Have a firm chair or bench with side arms that you can use for support when you get dressed. What can I do in the kitchen? Clean up any spills right away. If you need to reach something  above you, use a step stool with a grab bar. Keep electrical cords out of the way. Do not use floor polish or wax that makes floors slippery. What can I do with my stairs? Do not leave anything on the stairs. Make sure that you have a light switch at the top and the bottom of the stairs. Make sure that there are handrails on both sides of the stairs. Fix handrails that are broken or loose. Install non-slip stair treads on all your stairs if they do not have carpet. Avoid having throw rugs at the top or bottom of the stairs. Choose a carpet that does not hide the edge of the steps on the stairs. Make sure that the carpet is firmly attached to the stairs. Fix carpet that is loose or worn. What can I do on the outside of my home? Use bright outdoor lighting. Fix the edges of walkways and driveways and fix any cracks. Clear paths of anything that can make you trip, such as tools or rocks. Add color or contrast paint or tape to clearly mark and help you see anything that might make you trip as you walk through a door, such as a raised step or threshold. Trim any bushes or trees on paths to your home. Check to see if handrails are loose   or broken and that both sides of all steps have handrails. Install guardrails along the edges of any raised decks and porches. Have leaves, snow, or ice cleared regularly. Use sand, salt, or ice melter on paths if you live where there is ice and snow during the winter. Clean up any spills in your garage right away. This includes grease or oil spills. What other actions can I take? Review your medicines with your doctor. Some medicines can cause dizziness or changes in blood pressure, which increase your risk of falling. Wear shoes that: Have a low heel. Do not wear high heels. Have rubber bottoms and are closed at the toe. Feel good on your feet and fit well. Use tools that help you move around if needed. These include: Canes. Walkers. Scooters. Crutches. Ask  your doctor what else you can do to help prevent falls. This may include seeing a physical therapist to learn to do exercises to move better and get stronger. Where to find more information Centers for Disease Control and Prevention, [employer]: cdc.gov National Institute on Aging: nia.nih.gov National Institute on Aging: nia.nih.gov Contact a doctor if: You are afraid of falling at home. You feel weak, drowsy, or dizzy at home. You fall at home. Get help right away if you: Lose consciousness or have trouble moving after a fall. Have a fall that causes a head injury. These symptoms may be an emergency. Get help right away. Call 911. Do not wait to see if the symptoms will go away. Do not drive yourself to the hospital. This information is not intended to replace advice given to you by your health care provider. Make sure you discuss any questions you have with your health care provider. Document Revised: 04/15/2022 Document Reviewed: 04/15/2022 Elsevier Patient Education  2023 Elsevier Inc.  

## 2022-11-22 LAB — CBC WITH DIFFERENTIAL/PLATELET
Basophils Absolute: 0 10*3/uL (ref 0.0–0.2)
Basos: 1 %
EOS (ABSOLUTE): 0.1 10*3/uL (ref 0.0–0.4)
Eos: 2 %
Hematocrit: 42.9 % (ref 34.0–46.6)
Hemoglobin: 13.9 g/dL (ref 11.1–15.9)
Immature Grans (Abs): 0 10*3/uL (ref 0.0–0.1)
Immature Granulocytes: 0 %
Lymphocytes Absolute: 1.4 10*3/uL (ref 0.7–3.1)
Lymphs: 25 %
MCH: 30.4 pg (ref 26.6–33.0)
MCHC: 32.4 g/dL (ref 31.5–35.7)
MCV: 94 fL (ref 79–97)
Monocytes Absolute: 0.8 10*3/uL (ref 0.1–0.9)
Monocytes: 13 %
Neutrophils Absolute: 3.4 10*3/uL (ref 1.4–7.0)
Neutrophils: 59 %
Platelets: 160 10*3/uL (ref 150–450)
RBC: 4.57 x10E6/uL (ref 3.77–5.28)
RDW: 16.4 % — ABNORMAL HIGH (ref 11.7–15.4)
WBC: 5.7 10*3/uL (ref 3.4–10.8)

## 2022-11-22 LAB — LIPID PANEL
Chol/HDL Ratio: 3.6 ratio (ref 0.0–4.4)
Cholesterol, Total: 111 mg/dL (ref 100–199)
HDL: 31 mg/dL — ABNORMAL LOW (ref >39)
LDL Chol Calc (NIH): 65 mg/dL (ref 0–99)
Triglycerides: 73 mg/dL (ref 0–149)
VLDL Cholesterol Cal: 15 mg/dL (ref 5–40)

## 2022-11-22 LAB — CMP14+EGFR
ALT: 16 IU/L (ref 0–32)
AST: 38 IU/L (ref 0–40)
Albumin/Globulin Ratio: 1.6 (ref 1.2–2.2)
Albumin: 4.3 g/dL (ref 3.6–4.6)
Alkaline Phosphatase: 60 IU/L (ref 44–121)
BUN/Creatinine Ratio: 12 (ref 12–28)
BUN: 16 mg/dL (ref 10–36)
Bilirubin Total: 1.6 mg/dL — ABNORMAL HIGH (ref 0.0–1.2)
CO2: 27 mmol/L (ref 20–29)
Calcium: 9.7 mg/dL (ref 8.7–10.3)
Chloride: 98 mmol/L (ref 96–106)
Creatinine, Ser: 1.33 mg/dL — ABNORMAL HIGH (ref 0.57–1.00)
Globulin, Total: 2.7 g/dL (ref 1.5–4.5)
Glucose: 101 mg/dL — ABNORMAL HIGH (ref 70–99)
Potassium: 3.7 mmol/L (ref 3.5–5.2)
Sodium: 142 mmol/L (ref 134–144)
Total Protein: 7 g/dL (ref 6.0–8.5)
eGFR: 37 mL/min/{1.73_m2} — ABNORMAL LOW (ref >59)

## 2022-12-28 DIAGNOSIS — S51811A Laceration without foreign body of right forearm, initial encounter: Secondary | ICD-10-CM | POA: Diagnosis present

## 2022-12-28 DIAGNOSIS — Z79899 Other long term (current) drug therapy: Secondary | ICD-10-CM | POA: Diagnosis not present

## 2022-12-28 DIAGNOSIS — N390 Urinary tract infection, site not specified: Secondary | ICD-10-CM | POA: Diagnosis not present

## 2022-12-28 DIAGNOSIS — I083 Combined rheumatic disorders of mitral, aortic and tricuspid valves: Secondary | ICD-10-CM | POA: Diagnosis not present

## 2022-12-28 DIAGNOSIS — W19XXXA Unspecified fall, initial encounter: Secondary | ICD-10-CM | POA: Diagnosis not present

## 2022-12-28 DIAGNOSIS — W1830XA Fall on same level, unspecified, initial encounter: Secondary | ICD-10-CM | POA: Diagnosis not present

## 2022-12-28 DIAGNOSIS — W1830XD Fall on same level, unspecified, subsequent encounter: Secondary | ICD-10-CM | POA: Diagnosis not present

## 2022-12-28 DIAGNOSIS — B9689 Other specified bacterial agents as the cause of diseases classified elsewhere: Secondary | ICD-10-CM | POA: Diagnosis not present

## 2022-12-28 DIAGNOSIS — Z792 Long term (current) use of antibiotics: Secondary | ICD-10-CM | POA: Diagnosis not present

## 2022-12-28 DIAGNOSIS — I480 Paroxysmal atrial fibrillation: Secondary | ICD-10-CM | POA: Diagnosis present

## 2022-12-28 DIAGNOSIS — R531 Weakness: Secondary | ICD-10-CM | POA: Diagnosis not present

## 2022-12-28 DIAGNOSIS — Z0389 Encounter for observation for other suspected diseases and conditions ruled out: Secondary | ICD-10-CM | POA: Diagnosis not present

## 2022-12-28 DIAGNOSIS — N3000 Acute cystitis without hematuria: Secondary | ICD-10-CM | POA: Diagnosis present

## 2022-12-28 DIAGNOSIS — E876 Hypokalemia: Secondary | ICD-10-CM | POA: Diagnosis present

## 2022-12-28 DIAGNOSIS — J90 Pleural effusion, not elsewhere classified: Secondary | ICD-10-CM | POA: Diagnosis not present

## 2022-12-28 DIAGNOSIS — I10 Essential (primary) hypertension: Secondary | ICD-10-CM | POA: Diagnosis present

## 2022-12-28 DIAGNOSIS — R55 Syncope and collapse: Secondary | ICD-10-CM | POA: Diagnosis not present

## 2022-12-28 DIAGNOSIS — Z043 Encounter for examination and observation following other accident: Secondary | ICD-10-CM | POA: Diagnosis not present

## 2022-12-28 DIAGNOSIS — R54 Age-related physical debility: Secondary | ICD-10-CM | POA: Diagnosis present

## 2022-12-28 DIAGNOSIS — Z7951 Long term (current) use of inhaled steroids: Secondary | ICD-10-CM | POA: Diagnosis not present

## 2022-12-28 DIAGNOSIS — W19XXXD Unspecified fall, subsequent encounter: Secondary | ICD-10-CM | POA: Diagnosis not present

## 2022-12-29 DIAGNOSIS — I480 Paroxysmal atrial fibrillation: Secondary | ICD-10-CM | POA: Diagnosis not present

## 2022-12-29 DIAGNOSIS — R55 Syncope and collapse: Secondary | ICD-10-CM | POA: Diagnosis not present

## 2022-12-29 DIAGNOSIS — W19XXXD Unspecified fall, subsequent encounter: Secondary | ICD-10-CM | POA: Diagnosis not present

## 2022-12-29 DIAGNOSIS — E876 Hypokalemia: Secondary | ICD-10-CM | POA: Diagnosis not present

## 2022-12-29 DIAGNOSIS — Z79899 Other long term (current) drug therapy: Secondary | ICD-10-CM | POA: Diagnosis not present

## 2022-12-29 DIAGNOSIS — N3000 Acute cystitis without hematuria: Secondary | ICD-10-CM | POA: Diagnosis not present

## 2022-12-29 DIAGNOSIS — Z792 Long term (current) use of antibiotics: Secondary | ICD-10-CM | POA: Diagnosis not present

## 2022-12-30 DIAGNOSIS — Z7951 Long term (current) use of inhaled steroids: Secondary | ICD-10-CM | POA: Diagnosis not present

## 2022-12-30 DIAGNOSIS — E876 Hypokalemia: Secondary | ICD-10-CM | POA: Diagnosis present

## 2022-12-30 DIAGNOSIS — R55 Syncope and collapse: Secondary | ICD-10-CM | POA: Diagnosis not present

## 2022-12-30 DIAGNOSIS — I10 Essential (primary) hypertension: Secondary | ICD-10-CM | POA: Diagnosis present

## 2022-12-30 DIAGNOSIS — Z792 Long term (current) use of antibiotics: Secondary | ICD-10-CM | POA: Diagnosis not present

## 2022-12-30 DIAGNOSIS — I480 Paroxysmal atrial fibrillation: Secondary | ICD-10-CM | POA: Diagnosis present

## 2022-12-30 DIAGNOSIS — R54 Age-related physical debility: Secondary | ICD-10-CM | POA: Diagnosis present

## 2022-12-30 DIAGNOSIS — Z0389 Encounter for observation for other suspected diseases and conditions ruled out: Secondary | ICD-10-CM | POA: Diagnosis not present

## 2022-12-30 DIAGNOSIS — S51811A Laceration without foreign body of right forearm, initial encounter: Secondary | ICD-10-CM | POA: Diagnosis present

## 2022-12-30 DIAGNOSIS — N3000 Acute cystitis without hematuria: Secondary | ICD-10-CM | POA: Diagnosis present

## 2022-12-30 DIAGNOSIS — B9689 Other specified bacterial agents as the cause of diseases classified elsewhere: Secondary | ICD-10-CM | POA: Diagnosis not present

## 2022-12-30 DIAGNOSIS — Z79899 Other long term (current) drug therapy: Secondary | ICD-10-CM | POA: Diagnosis not present

## 2022-12-30 DIAGNOSIS — W1830XD Fall on same level, unspecified, subsequent encounter: Secondary | ICD-10-CM | POA: Diagnosis not present

## 2022-12-30 DIAGNOSIS — I083 Combined rheumatic disorders of mitral, aortic and tricuspid valves: Secondary | ICD-10-CM | POA: Diagnosis not present

## 2022-12-31 DIAGNOSIS — Z0389 Encounter for observation for other suspected diseases and conditions ruled out: Secondary | ICD-10-CM | POA: Diagnosis not present

## 2023-01-01 ENCOUNTER — Telehealth: Payer: Self-pay

## 2023-01-01 NOTE — Transitions of Care (Post Inpatient/ED Visit) (Signed)
   01/01/2023  Name: Morgan Spears MRN: [id number] DOB: 01/18/1925  Today's TOC FU Call Status: Today's TOC FU Call Status:: Successful TOC FU Call Competed TOC FU Call Complete Date: 01/01/23  Transition Care Management Follow-up Telephone Call Date of Discharge: 12/31/22 Discharge Facility: Other ([facility]) Name of Other ([facility]) Discharge Facility: [facility] Type of Discharge: Inpatient Admission Primary Inpatient Discharge Diagnosis:: UTI, Fall How have you been since you were released from the hospital?: Better (Spoke with patient's daughter Dewayne Hatch) Any questions or concerns?: No  Items Reviewed: Did you receive and understand the discharge instructions provided?: Yes Medications obtained,verified, and reconciled?: Yes (Medications Reviewed) Any new allergies since your discharge?: No Dietary orders reviewed?: No Do you have support at home?: Yes People in Home: child(ren), adult Name of Support/Comfort Primary Source: Loraine Maple, daughter  Medications Reviewed Today: Medications Reviewed Today     Reviewed by Jodelle Gross, RN (Case Manager) on 01/01/23 at 1601  Med List Status: <None>   Medication Order Taking? Sig Documenting Provider Last Dose Status Informant  fluticasone-salmeterol (ADVAIR) 100-50 MCG/ACT AEPB [id number] No USE 1 INHALATION TWICE DAILY Daphine Deutscher, Mary-Margaret, FNP Taking Active   furosemide (LASIX) 20 MG tablet [id number]  Take 1 tablet (20 mg total) by mouth daily. Daphine Deutscher, Mary-Margaret, FNP  Active   lisinopril (ZESTRIL) 40 MG tablet [id number]  Take 1 tablet (40 mg total) by mouth daily. Bennie Pierini, FNP  Active   metoprolol tartrate (LOPRESSOR) 50 MG tablet [id number]  TAKE ONE (1) TABLET BY MOUTH TWO (2) TIMES DAILY Daphine Deutscher, Mary-Margaret, FNP  Active   rosuvastatin (CRESTOR) 10 MG tablet [id number]  TAKE ONE (1) TABLET EACH DAY Daphine Deutscher, Mary-Margaret, FNP  Active             Home Care and Equipment/Supplies: Were Home  Health Services Ordered?: Yes Name of Home Health Agency:: [facility] Has Agency set up a time to come to your home?: Yes First Home Health Visit Date: 01/02/23 Any new equipment or medical supplies ordered?: No  Functional Questionnaire: Do you need assistance with bathing/showering or dressing?: Yes Do you need assistance with meal preparation?: Yes Do you need assistance with eating?: Yes Do you have difficulty maintaining continence: No Do you need assistance with getting out of bed/getting out of a chair/moving?: Yes Do you have difficulty managing or taking your medications?: Yes  Follow up appointments reviewed: PCP Follow-up appointment confirmed?: Yes Date of PCP follow-up appointment?: 01/07/23 Follow-up Provider: G[facility] Follow-up appointment confirmed?: NA Do you need transportation to your follow-up appointment?: No Do you understand care options if your condition(s) worsen?: Yes-patient verbalized understanding  SDOH Interventions Today    Flowsheet Row Most Recent Value  SDOH Interventions   Food Insecurity Interventions Intervention Not Indicated  Housing Interventions Intervention Not Indicated  Transportation Interventions Intervention Not Indicated      Interventions Today    Flowsheet Row Most Recent Value  Chronic Disease   Chronic disease during today's visit Hypertension (HTN)  General Interventions   General Interventions Discussed/Reviewed General Interventions Discussed       TOC Interventions Today    Flowsheet Row Most Recent Value  TOC Interventions   TOC Interventions Discussed/Reviewed TOC Interventions Discussed, TOC Interventions Reviewed, Contacted Home Health RN/OT/PT  [Verified orders and start date]      Jodelle Gross, RN, BSN, CCM Care Management Coordinator [facility] Phone: [phone]/Fax: [phone]

## 2023-01-02 ENCOUNTER — Telehealth: Payer: Self-pay | Admitting: *Deleted

## 2023-01-02 DIAGNOSIS — Z7951 Long term (current) use of inhaled steroids: Secondary | ICD-10-CM | POA: Diagnosis not present

## 2023-01-02 DIAGNOSIS — Z9181 History of falling: Secondary | ICD-10-CM | POA: Diagnosis not present

## 2023-01-02 DIAGNOSIS — I480 Paroxysmal atrial fibrillation: Secondary | ICD-10-CM | POA: Diagnosis not present

## 2023-01-02 DIAGNOSIS — S41111D Laceration without foreign body of right upper arm, subsequent encounter: Secondary | ICD-10-CM | POA: Diagnosis not present

## 2023-01-02 DIAGNOSIS — N3000 Acute cystitis without hematuria: Secondary | ICD-10-CM | POA: Diagnosis not present

## 2023-01-02 NOTE — Telephone Encounter (Signed)
TC from w/ Morgan Spears from [facility] Did [facility] w/ pt, she had a fall to floor on Sun/Mon, crawled around on floor her life alert was on the table, daughter found her on floor. No stroke. Morgan Spears did Orthostatic BP's sitting 150/84 standing 88/66 pt stated her legs were shaky and weak, no c/o dizziness. Provider in hospital increased Metoprolol to 75 mg BID, was encouraged to increase fluids in between meals also has skin tear on R forearm from fall, dressing w/ xeroform. Please advise if any changes needed.

## 2023-01-03 NOTE — Telephone Encounter (Signed)
Ok will see patient in office when scheduled.

## 2023-01-03 NOTE — Telephone Encounter (Signed)
Pt is scheduled for 5/14

## 2023-01-06 DIAGNOSIS — S41111D Laceration without foreign body of right upper arm, subsequent encounter: Secondary | ICD-10-CM | POA: Diagnosis not present

## 2023-01-06 DIAGNOSIS — N3000 Acute cystitis without hematuria: Secondary | ICD-10-CM | POA: Diagnosis not present

## 2023-01-06 DIAGNOSIS — Z9181 History of falling: Secondary | ICD-10-CM | POA: Diagnosis not present

## 2023-01-06 DIAGNOSIS — I480 Paroxysmal atrial fibrillation: Secondary | ICD-10-CM | POA: Diagnosis not present

## 2023-01-06 DIAGNOSIS — Z7951 Long term (current) use of inhaled steroids: Secondary | ICD-10-CM | POA: Diagnosis not present

## 2023-01-07 ENCOUNTER — Ambulatory Visit (INDEPENDENT_AMBULATORY_CARE_PROVIDER_SITE_OTHER): Payer: MEDICARE | Admitting: Nurse Practitioner

## 2023-01-07 ENCOUNTER — Encounter: Payer: Self-pay | Admitting: Nurse Practitioner

## 2023-01-07 VITALS — BP 134/75 | HR 74 | Temp 97.6°F | Resp 20

## 2023-01-07 DIAGNOSIS — Z7689 Persons encountering health services in other specified circumstances: Secondary | ICD-10-CM

## 2023-01-07 DIAGNOSIS — Z7951 Long term (current) use of inhaled steroids: Secondary | ICD-10-CM | POA: Diagnosis not present

## 2023-01-07 DIAGNOSIS — S41111D Laceration without foreign body of right upper arm, subsequent encounter: Secondary | ICD-10-CM | POA: Diagnosis not present

## 2023-01-07 DIAGNOSIS — I480 Paroxysmal atrial fibrillation: Secondary | ICD-10-CM | POA: Diagnosis not present

## 2023-01-07 DIAGNOSIS — N3000 Acute cystitis without hematuria: Secondary | ICD-10-CM

## 2023-01-07 DIAGNOSIS — W19XXXD Unspecified fall, subsequent encounter: Secondary | ICD-10-CM

## 2023-01-07 DIAGNOSIS — Z9181 History of falling: Secondary | ICD-10-CM | POA: Diagnosis not present

## 2023-01-07 NOTE — Progress Notes (Signed)
   Subjective:    Patient ID: Morgan Spears, female    DOB: 1925-01-29, 89 y.o.   MRN: [id number]   Chief Complaint: Transitions Of Care   HPI Today's visit was for Transitional Care Management.  The patient was discharged from [facility] on 12/31/22 with a primary diagnosis of UTI and fall.   Contact with the patient and/or caregiver, by a clinical staff member, was made on 01/01/23 and was documented as a telephone encounter within the EMR.  Through chart review and discussion with the patient I have determined that management of their condition is of moderate complexity.    Patient went to hospital after a fall at homeand was dx with uti. Was admitted for a couple of days and treated with IV antibiotics. They did some xrays and she had no broken bones.      Patient Active Problem List   Diagnosis Date Noted   Permanent atrial fibrillation (HCC) 08/13/2018   Simple chronic bronchitis (HCC) 04/08/2016   Primary hypertension 12/08/2014   Osteopenia    Hyperlipidemia with target LDL less than 100    Diffuse cystic mastopathy    Postmenopausal atrophic vaginitis        Review of Systems  Constitutional:  Negative for diaphoresis.  Eyes:  Negative for pain.  Respiratory:  Negative for shortness of breath.   Cardiovascular:  Negative for chest pain, palpitations and leg swelling.  Gastrointestinal:  Negative for abdominal pain.  Endocrine: Negative for polydipsia.  Skin:  Negative for rash.  Neurological:  Negative for dizziness, weakness and headaches.  Hematological:  Does not bruise/bleed easily.  All other systems reviewed and are negative.      Objective:   Physical Exam Constitutional:      Appearance: Normal appearance.  Cardiovascular:     Rate and Rhythm: Normal rate and regular rhythm.     Heart sounds: Normal heart sounds.  Pulmonary:     Breath sounds: Normal breath sounds.  Skin:    General: Skin is warm.     Comments: Skin tear right forearm-  healing nicely  Neurological:     General: No focal deficit present.     Mental Status: She is alert and oriented to person, place, and time.  Psychiatric:        Mood and Affect: Mood normal.        Behavior: Behavior normal.    BP 134/75   Pulse 74   Temp 97.6 F (36.4 C) (Temporal)   Resp 20         Assessment & Plan:  Morgan Spears in today with chief complaint of Transitions Of Care   1. Encounter for support and coordination of transition of care Hospital records reviewed  2. Acute cystitis without hematuria Report any dysuria  3. Fall, subsequent encounter Fall prevention reviewed    The above assessment and management plan was discussed with the patient. The patient verbalized understanding of and has agreed to the management plan. Patient is aware to call the clinic if symptoms persist or worsen. Patient is aware when to return to the clinic for a follow-up visit. Patient educated on when it is appropriate to go to the emergency department.   Mary-Margaret Daphine Deutscher, FNP

## 2023-01-10 ENCOUNTER — Ambulatory Visit (INDEPENDENT_AMBULATORY_CARE_PROVIDER_SITE_OTHER): Payer: MEDICARE

## 2023-01-10 DIAGNOSIS — N3000 Acute cystitis without hematuria: Secondary | ICD-10-CM | POA: Diagnosis not present

## 2023-01-10 DIAGNOSIS — S41111D Laceration without foreign body of right upper arm, subsequent encounter: Secondary | ICD-10-CM

## 2023-01-10 DIAGNOSIS — Z7951 Long term (current) use of inhaled steroids: Secondary | ICD-10-CM | POA: Diagnosis not present

## 2023-01-10 DIAGNOSIS — Z9181 History of falling: Secondary | ICD-10-CM

## 2023-01-10 DIAGNOSIS — I480 Paroxysmal atrial fibrillation: Secondary | ICD-10-CM | POA: Diagnosis not present

## 2023-01-13 DIAGNOSIS — I480 Paroxysmal atrial fibrillation: Secondary | ICD-10-CM | POA: Diagnosis not present

## 2023-01-13 DIAGNOSIS — S41111D Laceration without foreign body of right upper arm, subsequent encounter: Secondary | ICD-10-CM | POA: Diagnosis not present

## 2023-01-13 DIAGNOSIS — Z7951 Long term (current) use of inhaled steroids: Secondary | ICD-10-CM | POA: Diagnosis not present

## 2023-01-13 DIAGNOSIS — N3000 Acute cystitis without hematuria: Secondary | ICD-10-CM | POA: Diagnosis not present

## 2023-01-13 DIAGNOSIS — Z9181 History of falling: Secondary | ICD-10-CM | POA: Diagnosis not present

## 2023-01-14 DIAGNOSIS — Z9181 History of falling: Secondary | ICD-10-CM | POA: Diagnosis not present

## 2023-01-14 DIAGNOSIS — Z7951 Long term (current) use of inhaled steroids: Secondary | ICD-10-CM | POA: Diagnosis not present

## 2023-01-14 DIAGNOSIS — S41111D Laceration without foreign body of right upper arm, subsequent encounter: Secondary | ICD-10-CM | POA: Diagnosis not present

## 2023-01-14 DIAGNOSIS — N3000 Acute cystitis without hematuria: Secondary | ICD-10-CM | POA: Diagnosis not present

## 2023-01-14 DIAGNOSIS — I480 Paroxysmal atrial fibrillation: Secondary | ICD-10-CM | POA: Diagnosis not present

## 2023-01-17 DIAGNOSIS — Z9181 History of falling: Secondary | ICD-10-CM | POA: Diagnosis not present

## 2023-01-17 DIAGNOSIS — I480 Paroxysmal atrial fibrillation: Secondary | ICD-10-CM | POA: Diagnosis not present

## 2023-01-17 DIAGNOSIS — N3000 Acute cystitis without hematuria: Secondary | ICD-10-CM | POA: Diagnosis not present

## 2023-01-17 DIAGNOSIS — S41111D Laceration without foreign body of right upper arm, subsequent encounter: Secondary | ICD-10-CM | POA: Diagnosis not present

## 2023-01-17 DIAGNOSIS — Z7951 Long term (current) use of inhaled steroids: Secondary | ICD-10-CM | POA: Diagnosis not present

## 2023-01-21 DIAGNOSIS — Z9181 History of falling: Secondary | ICD-10-CM | POA: Diagnosis not present

## 2023-01-21 DIAGNOSIS — Z7951 Long term (current) use of inhaled steroids: Secondary | ICD-10-CM | POA: Diagnosis not present

## 2023-01-21 DIAGNOSIS — S41111D Laceration without foreign body of right upper arm, subsequent encounter: Secondary | ICD-10-CM | POA: Diagnosis not present

## 2023-01-21 DIAGNOSIS — N3000 Acute cystitis without hematuria: Secondary | ICD-10-CM | POA: Diagnosis not present

## 2023-01-21 DIAGNOSIS — I480 Paroxysmal atrial fibrillation: Secondary | ICD-10-CM | POA: Diagnosis not present

## 2023-01-23 DIAGNOSIS — Z9181 History of falling: Secondary | ICD-10-CM | POA: Diagnosis not present

## 2023-01-23 DIAGNOSIS — Z7951 Long term (current) use of inhaled steroids: Secondary | ICD-10-CM | POA: Diagnosis not present

## 2023-01-23 DIAGNOSIS — N3000 Acute cystitis without hematuria: Secondary | ICD-10-CM | POA: Diagnosis not present

## 2023-01-23 DIAGNOSIS — I480 Paroxysmal atrial fibrillation: Secondary | ICD-10-CM | POA: Diagnosis not present

## 2023-01-23 DIAGNOSIS — S41111D Laceration without foreign body of right upper arm, subsequent encounter: Secondary | ICD-10-CM | POA: Diagnosis not present

## 2023-01-24 ENCOUNTER — Other Ambulatory Visit: Payer: Self-pay | Admitting: Nurse Practitioner

## 2023-01-24 DIAGNOSIS — J410 Simple chronic bronchitis: Secondary | ICD-10-CM

## 2023-01-27 DIAGNOSIS — Z7951 Long term (current) use of inhaled steroids: Secondary | ICD-10-CM | POA: Diagnosis not present

## 2023-01-27 DIAGNOSIS — Z9181 History of falling: Secondary | ICD-10-CM | POA: Diagnosis not present

## 2023-01-27 DIAGNOSIS — R55 Syncope and collapse: Secondary | ICD-10-CM | POA: Diagnosis not present

## 2023-01-27 DIAGNOSIS — I480 Paroxysmal atrial fibrillation: Secondary | ICD-10-CM | POA: Diagnosis not present

## 2023-01-27 DIAGNOSIS — S41111D Laceration without foreign body of right upper arm, subsequent encounter: Secondary | ICD-10-CM | POA: Diagnosis not present

## 2023-01-27 DIAGNOSIS — N3000 Acute cystitis without hematuria: Secondary | ICD-10-CM | POA: Diagnosis not present

## 2023-01-28 DIAGNOSIS — R55 Syncope and collapse: Secondary | ICD-10-CM | POA: Diagnosis not present

## 2023-01-30 ENCOUNTER — Other Ambulatory Visit: Payer: Self-pay | Admitting: Nurse Practitioner

## 2023-01-30 DIAGNOSIS — Z9181 History of falling: Secondary | ICD-10-CM | POA: Diagnosis not present

## 2023-01-30 DIAGNOSIS — Z7951 Long term (current) use of inhaled steroids: Secondary | ICD-10-CM | POA: Diagnosis not present

## 2023-01-30 DIAGNOSIS — S41111D Laceration without foreign body of right upper arm, subsequent encounter: Secondary | ICD-10-CM | POA: Diagnosis not present

## 2023-01-30 DIAGNOSIS — N3000 Acute cystitis without hematuria: Secondary | ICD-10-CM | POA: Diagnosis not present

## 2023-01-30 DIAGNOSIS — I480 Paroxysmal atrial fibrillation: Secondary | ICD-10-CM | POA: Diagnosis not present

## 2023-01-31 DIAGNOSIS — Z9181 History of falling: Secondary | ICD-10-CM | POA: Diagnosis not present

## 2023-01-31 DIAGNOSIS — Z7951 Long term (current) use of inhaled steroids: Secondary | ICD-10-CM | POA: Diagnosis not present

## 2023-01-31 DIAGNOSIS — S41111D Laceration without foreign body of right upper arm, subsequent encounter: Secondary | ICD-10-CM | POA: Diagnosis not present

## 2023-01-31 DIAGNOSIS — I480 Paroxysmal atrial fibrillation: Secondary | ICD-10-CM | POA: Diagnosis not present

## 2023-01-31 DIAGNOSIS — N3000 Acute cystitis without hematuria: Secondary | ICD-10-CM | POA: Diagnosis not present

## 2023-02-01 DIAGNOSIS — N3000 Acute cystitis without hematuria: Secondary | ICD-10-CM | POA: Diagnosis not present

## 2023-02-01 DIAGNOSIS — S41111D Laceration without foreign body of right upper arm, subsequent encounter: Secondary | ICD-10-CM | POA: Diagnosis not present

## 2023-02-01 DIAGNOSIS — Z7951 Long term (current) use of inhaled steroids: Secondary | ICD-10-CM | POA: Diagnosis not present

## 2023-02-01 DIAGNOSIS — I480 Paroxysmal atrial fibrillation: Secondary | ICD-10-CM | POA: Diagnosis not present

## 2023-02-01 DIAGNOSIS — Z9181 History of falling: Secondary | ICD-10-CM | POA: Diagnosis not present

## 2023-02-03 DIAGNOSIS — N3000 Acute cystitis without hematuria: Secondary | ICD-10-CM | POA: Diagnosis not present

## 2023-02-03 DIAGNOSIS — I480 Paroxysmal atrial fibrillation: Secondary | ICD-10-CM | POA: Diagnosis not present

## 2023-02-03 DIAGNOSIS — Z9181 History of falling: Secondary | ICD-10-CM | POA: Diagnosis not present

## 2023-02-03 DIAGNOSIS — S41111D Laceration without foreign body of right upper arm, subsequent encounter: Secondary | ICD-10-CM | POA: Diagnosis not present

## 2023-02-03 DIAGNOSIS — Z7951 Long term (current) use of inhaled steroids: Secondary | ICD-10-CM | POA: Diagnosis not present

## 2023-02-10 DIAGNOSIS — I480 Paroxysmal atrial fibrillation: Secondary | ICD-10-CM | POA: Diagnosis not present

## 2023-02-10 DIAGNOSIS — Z7951 Long term (current) use of inhaled steroids: Secondary | ICD-10-CM | POA: Diagnosis not present

## 2023-02-10 DIAGNOSIS — Z9181 History of falling: Secondary | ICD-10-CM | POA: Diagnosis not present

## 2023-02-10 DIAGNOSIS — S41111D Laceration without foreign body of right upper arm, subsequent encounter: Secondary | ICD-10-CM | POA: Diagnosis not present

## 2023-02-10 DIAGNOSIS — N3000 Acute cystitis without hematuria: Secondary | ICD-10-CM | POA: Diagnosis not present

## 2023-02-12 ENCOUNTER — Telehealth: Payer: Self-pay | Admitting: Nurse Practitioner

## 2023-02-12 NOTE — Telephone Encounter (Signed)
Pt having 1+ edema in both legs, fragile skin w/ skin tear on left leg, would like order to wrap w/ unna boots twice weekly, HH nurse will wrap 1x week daughter will do the other time Please advise

## 2023-02-13 ENCOUNTER — Ambulatory Visit (INDEPENDENT_AMBULATORY_CARE_PROVIDER_SITE_OTHER): Payer: MEDICARE | Admitting: Family

## 2023-02-13 ENCOUNTER — Encounter: Payer: Self-pay | Admitting: Family

## 2023-02-13 VITALS — BP 124/65 | HR 95 | Temp 97.4°F | Ht 61.0 in

## 2023-02-13 DIAGNOSIS — D649 Anemia, unspecified: Secondary | ICD-10-CM | POA: Diagnosis not present

## 2023-02-13 DIAGNOSIS — L03119 Cellulitis of unspecified part of limb: Secondary | ICD-10-CM

## 2023-02-13 DIAGNOSIS — R600 Localized edema: Secondary | ICD-10-CM

## 2023-02-13 DIAGNOSIS — I739 Peripheral vascular disease, unspecified: Secondary | ICD-10-CM | POA: Diagnosis not present

## 2023-02-13 MED ORDER — CEPHALEXIN 500 MG PO CAPS
500.0000 mg | ORAL_CAPSULE | Freq: Three times a day (TID) | ORAL | 0 refills | Status: DC
Start: 2023-02-13 — End: 2023-02-19

## 2023-02-13 MED ORDER — FUROSEMIDE 20 MG PO TABS
40.0000 mg | ORAL_TABLET | Freq: Every day | ORAL | 1 refills | Status: DC
Start: 2023-02-13 — End: 2023-02-19

## 2023-02-13 NOTE — Telephone Encounter (Signed)
Go ahead and place order for home health to apply unna boots

## 2023-02-13 NOTE — Telephone Encounter (Signed)
Patient seen in office today for unna boots and legs draining. Go ahead with orders

## 2023-02-13 NOTE — Progress Notes (Signed)
Subjective:    Patient ID: Morgan Spears, female    DOB: 29-Jun-1925, 89 y.o.   MRN: [id number]  Chief Complaint  Patient presents with   Leg Swelling    HPI PT presents to the office today with bilateral leg swelling that started last week. Reports clear discharge of bilateral legs. Daughter placed unna boots yesterday. Denies any pain, fevers.   She has Home Health that comes once a week for PT and nurse once a week since her discharge of hospital on 12/28/22 for A fib, fall, syncope.     Review of Systems  All other systems reviewed and are negative.  Family History  Problem Relation Age of Onset   Heart disease Mother    Hypertension Mother    Alcohol abuse Father    Cancer Sister    Emphysema Sister        smoker   Heart disease Brother    Alcohol abuse Brother    Social History   Socioeconomic History   Marital status: Widowed    Spouse name: Not on file   Number of children: 2   Years of education: 12th   Highest education level: 12th grade  Occupational History   Not on file  Tobacco Use   Smoking status: Never   Smokeless tobacco: Never  Vaping Use   Vaping Use: Never used  Substance and Sexual Activity   Alcohol use: No    Comment: occassioal wine - 1-2 servings per week   Drug use: No   Sexual activity: Never  Other Topics Concern   Not on file  Social History Narrative   Not on file   Social Determinants of Health   Financial Resource Strain: Low Risk  (11/21/2022)   Overall Financial Resource Strain (CARDIA)    Difficulty of Paying Living Expenses: Not hard at all  Food Insecurity: No Food Insecurity (01/01/2023)   Hunger Vital Sign    Worried About Running Out of Food in the Last Year: Never true    Ran Out of Food in the Last Year: Never true  Transportation Needs: No Transportation Needs (01/01/2023)   PRAPARE - Administrator, Civil Service (Medical): No    Lack of Transportation (Non-Medical): No  Physical Activity:  Unknown (11/21/2022)   Exercise Vital Sign    Days of Exercise per Week: 0 days    Minutes of Exercise per Session: Not on file  Stress: No Stress Concern Present (11/21/2022)   [employer] of Occupational Health - Occupational Stress Questionnaire    Feeling of Stress : Not at all  Social Connections: Moderately Isolated (11/21/2022)   Social Connection and Isolation Panel [NHANES]    Frequency of Communication with Friends and Family: Once a week    Frequency of Social Gatherings with Friends and Family: Once a week    Attends Religious Services: 1 to 4 times per year    Active Member of [employer] or Organizations: Yes    Attends Banker Meetings: 1 to 4 times per year    Marital Status: Widowed        Objective:   Physical Exam Vitals reviewed.  Constitutional:      General: She is not in acute distress.    Appearance: She is well-developed.  HENT:     Head: Normocephalic and atraumatic.  Eyes:     Pupils: Pupils are equal, round, and reactive to light.  Neck:     Thyroid: No thyromegaly.  Cardiovascular:  Rate and Rhythm: Normal rate and regular rhythm.     Heart sounds: Normal heart sounds. No murmur heard. Pulmonary:     Effort: Pulmonary effort is normal. No respiratory distress.     Breath sounds: Normal breath sounds. No wheezing.  Abdominal:     General: Bowel sounds are normal. There is no distension.     Palpations: Abdomen is soft.     Tenderness: There is no abdominal tenderness.  Musculoskeletal:        General: No tenderness. Normal range of motion.     Cervical back: Normal range of motion and neck supple.     Right lower leg: Edema (2+) present.     Left lower leg: Edema (2+) present.  Lymphadenopathy:     Comments: Bilateral pulses noted, right foot cool to touch. Left foot with 2+ edema   Skin:    General: Skin is warm and dry.  Neurological:     Mental Status: She is alert and oriented to person, place, and time.     Cranial  Nerves: No cranial nerve deficit.     Deep Tendon Reflexes: Reflexes are normal and symmetric.  Psychiatric:        Behavior: Behavior normal.        Thought Content: Thought content normal.        Judgment: Judgment normal.    Unna boots applied.    BP 124/65   Pulse 95   Temp (!) 97.4 F (36.3 C) (Temporal)   Ht 5\' 1"  (1.549 m)   BMI 17.38 kg/m      Assessment & Plan:   Morgan Spears comes in today with chief complaint of Leg Swelling   Diagnosis and orders addressed:  1. Peripheral edema - CBC with Differential/Platelet - Brain natriuretic peptide - BMP8+EGFR - furosemide (LASIX) 20 MG tablet; Take 2 tablets (40 mg total) by mouth daily.  Dispense: 90 tablet; Refill: 1 - Ambulatory referral to Vascular Surgery  2. PAD (peripheral artery disease) (HCC)  - CBC with Differential/Platelet - Brain natriuretic peptide - BMP8+EGFR - Ambulatory referral to Vascular Surgery  3. Cellulitis of lower extremity, unspecified laterality - CBC with Differential/Platelet - cephALEXin (KEFLEX) 500 MG capsule; Take 1 capsule (500 mg total) by mouth 3 (three) times daily.  Dispense: 21 capsule; Refill: 0 - BMP8+EGFR   Labs pending Will increase Lasix to 40 mg from 20 mg for next 5 days  Follow up with PCP next week Unna boots applied Discussion about ischemia, worried about bilateral feet given discoloration and left foot cool. Pulses are present. If discoloration worsens daughter will take her to ED. Referral to Vascular pending.  Keflex TID  Approx 44 mins spent with patient, daughter, education, and documenting   Follow up plan: 1 week with PCP   Jannifer Rodney, FNP

## 2023-02-13 NOTE — Patient Instructions (Signed)
Peripheral Vascular Disease  Peripheral vascular disease (PVD) is a disease of the blood vessels. PVD may also be called peripheral artery disease (PAD) or poor circulation. PVD is the blocking or hardening of the arteries anywhere within the circulatory system beyond the heart. This can result in a decreased supply of blood to the arms, legs, and internal organs, such as the stomach or kidneys. However, PVD most often affects a person's lower legs and feet. Without treatment, PVD often worsens. PVD can lead to acute limb ischemia. This occurs when an arm or leg suddenly has trouble getting enough blood. This is a medical emergency. What are the causes? The most common cause of PVD is atherosclerosis. This is a buildup of fatty material and other substances (plaque)inside your arteries. Pieces of plaque can break off from the walls of an artery and become stuck in a smaller artery, blocking blood flow and possibly causing acute limb ischemia. Other common causes of PVD include: Blood clots that form inside the blood vessels. Injuries to blood vessels. Diseases that cause inflammation of blood vessels or cause blood vessel tightening (spasms). What increases the risk? The following factors may make you more likely to develop this condition: A family history of PVD. Common medical conditions, including: High cholesterol. Diabetes. High blood pressure (hypertension). Heart disease. Known atherosclerotic disease in another area of the body. Past injury, such as burns or a broken bone. Other medical conditions, such as: Buerger's disease. This is caused by inflamed blood vessels in your hands and feet. Some forms of arthritis. Birth defects that affect the arteries in your legs. Kidney disease. Using tobacco and nicotine products. Not getting enough exercise. Obesity. Being age 65 or older, or being age 50 or older and having the other risk factors. What are the signs or symptoms? This  condition may cause different symptoms. Your symptoms depend on what body part is not getting enough blood. Common signs and symptoms include: Cramps in your buttocks, legs, and feet. Intermittent claudication. This is pain and weakness in your legs during activity that resolves with rest. Leg pain at rest and leg numbness, tingling, or weakness. Coldness in a leg or foot, especially when compared to the other leg or foot. Skin or hair changes. These can include: Hair loss. Shiny skin. Pale or bluish skin. Thick toenails. Inability to get or maintain an erection (erectile dysfunction). Tiredness (fatigue). Weak pulse or no pulse in the feet. People with PVD are more likely to develop open wounds (ulcers) and sores on their toes, feet, or legs. The ulcers or sores may take longer than normal to heal. How is this diagnosed? PVD is diagnosed based on your signs and symptoms, a physical exam, and your medical history. You may also have other tests to find the cause. Tests include: Ankle-brachial index test.This test compares the blood pressure readings of the legs and arms. This may also include an exercise ankle-brachial index test in which you walk on a treadmill to check your symptoms. Doppler ultrasound. This takes pictures of blood flow through your blood vessels. Imaging studies that use dye to show blood flow. These are: CT angiogram. Magnetic resonance angiogram, or MRA. How is this treated? Treatment for PVD depends on the cause of your condition, how severe your symptoms are, and your age. Underlying causes need to be treated and controlled. These include long-term (chronic) conditions, such as diabetes, high cholesterol, and hypertension. Treatment may include: Lifestyle changes, such as: Quitting tobacco use. Exercising regularly. Following a   low-fat, low-cholesterol diet. Not drinking alcohol. Taking medicines, such as: Blood thinners to prevent blood clots. Medicines to  improve blood flow. Medicines to improve cholesterol levels. Procedures, such as: Angioplasty. This uses an inflated balloon to open a blocked artery and improve blood flow. Stent implant. This inserts a small mesh tube to keep a blocked artery open. Peripheral bypass surgery. This reroutes blood flow around a blocked artery. Surgery to remove dead tissue from an infected wound (debridement). Amputation. This is surgical removal of the affected limb. It may be necessary in cases of acute limb ischemia when medical or surgical treatments have not helped. Follow these instructions at home: Medicines Take over-the-counter and prescription medicines only as told by your health care provider. If you are taking blood thinners: Talk with your health care provider before you take any medicines that contain aspirin or NSAIDs, such as ibuprofen. These medicines increase your risk for dangerous bleeding. Take your medicine exactly as told, at the same time every day. Avoid activities that could cause injury or bruising, and follow instructions about how to prevent falls. Wear a medical alert bracelet or carry a card that lists what medicines you take. Lifestyle     Exercise regularly. Ask your health care provider about some good activities for you. Talk with your health care provider about maintaining a healthy weight. If needed, ask about losing weight. Eat a diet that is low in fat and cholesterol. If you need help, talk with your health care provider. Do not drink alcohol. Do not use any products that contain nicotine or tobacco. These products include cigarettes, chewing tobacco, and vaping devices, such as e-cigarettes. If you need help quitting, ask your health care provider. General instructions Take good care of your feet. To do this: Wear comfortable shoes that fit well. Check your feet often for any cuts or sores. Get an annual influenza vaccine. Keep all follow-up visits. This is  important. Where to find more information Society for Vascular Surgery: vascular.org American Heart Association: heart.org National Heart, Lung, and Blood Institute: nhlbi.nih.gov Contact a health care provider if: You have leg cramps while walking. You have leg pain when you rest. Your leg or foot feels cold. Your skin changes color. You have erectile dysfunction. You have cuts or sores on your legs or feet that do not heal. Get help right away if: You have sudden changes in color and feeling of your arms or legs, such as: Your arm or leg turns cold, numb, and blue. Your arm or leg becomes red, warm, swollen, painful, or numb. You have any symptoms of a stroke. "BE FAST" is an easy way to remember the main warning signs of a stroke: B - Balance. Signs are dizziness, sudden trouble walking, or loss of balance. E - Eyes. Signs are trouble seeing or a sudden change in vision. F - Face. Signs are sudden weakness or numbness of the face, or the face or eyelid drooping on one side. A - Arms. Signs are weakness or numbness in an arm. This happens suddenly and usually on one side of the body. S - Speech. Signs are sudden trouble speaking, slurred speech, or trouble understanding what people say. T - Time. Time to call emergency services. Write down what time symptoms started. You have other signs of a stroke, such as: A sudden, severe headache with no known cause. Nausea or vomiting. Seizure. You have chest pain or trouble breathing. These symptoms may represent a serious problem that is an emergency.   Do not wait to see if the symptoms will go away. Get medical help right away. Call your local emergency services (911 in the [place] Do not drive yourself to the hospital. Summary Peripheral vascular disease (PVD) is a disease of the blood vessels. PVD is the blocking or hardening of the arteries anywhere within the circulatory system beyond the heart. PVD may cause different symptoms. Your  symptoms depend on what part of your body is not getting enough blood. Treatment for PVD depends on what caused it, how severe your symptoms are, and your age. This information is not intended to replace advice given to you by your health care provider. Make sure you discuss any questions you have with your health care provider. Document Revised: 02/14/2020 Document Reviewed: 02/14/2020 [employer] Patient Education  2024 [employer].

## 2023-02-14 LAB — BMP8+EGFR
BUN/Creatinine Ratio: 18 (ref 12–28)
BUN: 18 mg/dL (ref 10–36)
CO2: 26 mmol/L (ref 20–29)
Calcium: 8.8 mg/dL (ref 8.7–10.3)
Chloride: 95 mmol/L — ABNORMAL LOW (ref 96–106)
Creatinine, Ser: 1.02 mg/dL — ABNORMAL HIGH (ref 0.57–1.00)
Glucose: 89 mg/dL (ref 70–99)
Potassium: 3.8 mmol/L (ref 3.5–5.2)
Sodium: 135 mmol/L (ref 134–144)
eGFR: 51 mL/min/{1.73_m2} — ABNORMAL LOW (ref >59)

## 2023-02-14 LAB — CBC WITH DIFFERENTIAL/PLATELET
Basophils Absolute: 0 10*3/uL (ref 0.0–0.2)
Basos: 1 %
EOS (ABSOLUTE): 0.2 10*3/uL (ref 0.0–0.4)
Eos: 3 %
Hematocrit: 30.7 % — ABNORMAL LOW (ref 34.0–46.6)
Hemoglobin: 9.9 g/dL — ABNORMAL LOW (ref 11.1–15.9)
Immature Grans (Abs): 0 10*3/uL (ref 0.0–0.1)
Immature Granulocytes: 1 %
Lymphocytes Absolute: 1 10*3/uL (ref 0.7–3.1)
Lymphs: 16 %
MCH: 31.1 pg (ref 26.6–33.0)
MCHC: 32.2 g/dL (ref 31.5–35.7)
MCV: 97 fL (ref 79–97)
Monocytes Absolute: 0.8 10*3/uL (ref 0.1–0.9)
Monocytes: 13 %
Neutrophils Absolute: 4.1 10*3/uL (ref 1.4–7.0)
Neutrophils: 66 %
Platelets: 205 10*3/uL (ref 150–450)
RBC: 3.18 x10E6/uL — ABNORMAL LOW (ref 3.77–5.28)
RDW: 17.7 % — ABNORMAL HIGH (ref 11.7–15.4)
WBC: 6.1 10*3/uL (ref 3.4–10.8)

## 2023-02-14 LAB — BRAIN NATRIURETIC PEPTIDE: BNP: 821.4 pg/mL — ABNORMAL HIGH (ref 0.0–100.0)

## 2023-02-14 NOTE — Telephone Encounter (Signed)
LMOVM giving VO for unna boots 2x week w/ nursing doing 1x week and daughter being able to do other time during the week.

## 2023-02-17 ENCOUNTER — Other Ambulatory Visit: Payer: Self-pay | Admitting: Family

## 2023-02-17 ENCOUNTER — Other Ambulatory Visit: Payer: Self-pay

## 2023-02-17 DIAGNOSIS — S41111D Laceration without foreign body of right upper arm, subsequent encounter: Secondary | ICD-10-CM | POA: Diagnosis not present

## 2023-02-17 DIAGNOSIS — R627 Adult failure to thrive: Secondary | ICD-10-CM

## 2023-02-17 DIAGNOSIS — Z9181 History of falling: Secondary | ICD-10-CM | POA: Diagnosis not present

## 2023-02-17 DIAGNOSIS — Z7951 Long term (current) use of inhaled steroids: Secondary | ICD-10-CM | POA: Diagnosis not present

## 2023-02-17 DIAGNOSIS — I480 Paroxysmal atrial fibrillation: Secondary | ICD-10-CM | POA: Diagnosis not present

## 2023-02-17 DIAGNOSIS — N3000 Acute cystitis without hematuria: Secondary | ICD-10-CM | POA: Diagnosis not present

## 2023-02-18 ENCOUNTER — Ambulatory Visit: Payer: MEDICARE | Admitting: Nurse Practitioner

## 2023-02-18 LAB — IRON AND TIBC

## 2023-02-18 LAB — SPECIMEN STATUS REPORT

## 2023-02-19 LAB — IRON AND TIBC
Iron Saturation: 13 % — ABNORMAL LOW (ref 15–55)
Iron: 40 ug/dL (ref 27–139)
UIBC: 280 ug/dL (ref 118–369)

## 2023-02-24 DEATH — deceased

## 2023-05-26 ENCOUNTER — Ambulatory Visit: Payer: MEDICARE | Admitting: Nurse Practitioner
